# Patient Record
Sex: Female | Born: 1950 | Race: White | Hispanic: No | Marital: Married | Smoking: Never smoker
Health system: Southern US, Community
[De-identification: ages and names within clinical notes are randomized; demographics above are authoritative.]

## PROBLEM LIST (undated history)

## (undated) DIAGNOSIS — K219 Gastro-esophageal reflux disease without esophagitis: Secondary | ICD-10-CM

## (undated) DIAGNOSIS — T7840XA Allergy, unspecified, initial encounter: Secondary | ICD-10-CM

## (undated) DIAGNOSIS — G2581 Restless legs syndrome: Secondary | ICD-10-CM

## (undated) DIAGNOSIS — J40 Bronchitis, not specified as acute or chronic: Secondary | ICD-10-CM

## (undated) DIAGNOSIS — D099 Carcinoma in situ, unspecified: Secondary | ICD-10-CM

## (undated) DIAGNOSIS — M2540 Effusion, unspecified joint: Secondary | ICD-10-CM

## (undated) DIAGNOSIS — G252 Other specified forms of tremor: Secondary | ICD-10-CM

## (undated) DIAGNOSIS — R32 Unspecified urinary incontinence: Secondary | ICD-10-CM

## (undated) DIAGNOSIS — E7800 Pure hypercholesterolemia, unspecified: Secondary | ICD-10-CM

## (undated) DIAGNOSIS — M160 Bilateral primary osteoarthritis of hip: Secondary | ICD-10-CM

## (undated) DIAGNOSIS — F32A Depression, unspecified: Secondary | ICD-10-CM

## (undated) DIAGNOSIS — F329 Major depressive disorder, single episode, unspecified: Secondary | ICD-10-CM

## (undated) DIAGNOSIS — J45909 Unspecified asthma, uncomplicated: Secondary | ICD-10-CM

## (undated) DIAGNOSIS — M2550 Pain in unspecified joint: Secondary | ICD-10-CM

## (undated) HISTORY — PX: ANAL FISSURE REPAIR: SHX2312

## (undated) HISTORY — PX: COLONOSCOPY: SHX174

## (undated) HISTORY — PX: TONSILLECTOMY: SUR1361

## (undated) HISTORY — PX: OTHER SURGICAL HISTORY: SHX169

## (undated) HISTORY — PX: INCONTINENCE SURGERY: SHX676

---
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
## 2014-11-17 LAB — PULMONARY FUNCTION TEST

## 2014-12-02 ENCOUNTER — Encounter (HOSPITAL_COMMUNITY)
Admission: RE | Admit: 2014-12-02 | Discharge: 2014-12-02 | Disposition: A | Discharge status: Home / Self Care | Payer: Medicare Other | Source: Ambulatory Visit | Attending: Orthopaedic Surgery | Admitting: Orthopaedic Surgery

## 2014-12-02 ENCOUNTER — Encounter (HOSPITAL_COMMUNITY)
Admission: RE | Admit: 2014-12-02 | Discharge: 2014-12-02 | Disposition: A | Discharge status: Home / Self Care | Payer: Medicare Other | Source: Ambulatory Visit | Attending: Orthopedic Surgery | Admitting: Orthopedic Surgery

## 2014-12-02 ENCOUNTER — Encounter (HOSPITAL_COMMUNITY): Payer: Self-pay

## 2014-12-02 DIAGNOSIS — Z01812 Encounter for preprocedural laboratory examination: Secondary | ICD-10-CM | POA: Diagnosis not present

## 2014-12-02 DIAGNOSIS — Z79899 Other long term (current) drug therapy: Secondary | ICD-10-CM | POA: Diagnosis not present

## 2014-12-02 DIAGNOSIS — Z01810 Encounter for preprocedural cardiovascular examination: Secondary | ICD-10-CM | POA: Diagnosis not present

## 2014-12-02 DIAGNOSIS — Z882 Allergy status to sulfonamides status: Secondary | ICD-10-CM | POA: Diagnosis not present

## 2014-12-02 DIAGNOSIS — J45909 Unspecified asthma, uncomplicated: Secondary | ICD-10-CM | POA: Diagnosis not present

## 2014-12-02 DIAGNOSIS — M1611 Unilateral primary osteoarthritis, right hip: Secondary | ICD-10-CM | POA: Insufficient documentation

## 2014-12-02 DIAGNOSIS — M879 Osteonecrosis, unspecified: Secondary | ICD-10-CM | POA: Insufficient documentation

## 2014-12-02 DIAGNOSIS — Z01818 Encounter for other preprocedural examination: Secondary | ICD-10-CM | POA: Diagnosis present

## 2014-12-02 DIAGNOSIS — Z0183 Encounter for blood typing: Secondary | ICD-10-CM | POA: Diagnosis not present

## 2014-12-02 HISTORY — DX: Bronchitis, not specified as acute or chronic: J40

## 2014-12-02 HISTORY — DX: Unspecified asthma, uncomplicated: J45.909

## 2014-12-02 HISTORY — DX: Bilateral primary osteoarthritis of hip: M16.0

## 2014-12-02 HISTORY — DX: Major depressive disorder, single episode, unspecified: F32.9

## 2014-12-02 HISTORY — DX: Depression, unspecified: F32.A

## 2014-12-02 HISTORY — DX: Pure hypercholesterolemia, unspecified: E78.00

## 2014-12-02 LAB — COMPREHENSIVE METABOLIC PANEL
ALBUMIN: 4.3 g/dL (ref 3.5–5.0)
ALK PHOS: 86 U/L (ref 38–126)
ALT: 38 U/L (ref 14–54)
AST: 34 U/L (ref 15–41)
Anion gap: 10 (ref 5–15)
BUN: 14 mg/dL (ref 6–20)
CHLORIDE: 104 mmol/L (ref 101–111)
CO2: 25 mmol/L (ref 22–32)
CREATININE: 0.64 mg/dL (ref 0.44–1.00)
Calcium: 10.4 mg/dL — ABNORMAL HIGH (ref 8.9–10.3)
GFR calc Af Amer: >60 mL/min (ref >60)
GLUCOSE: 94 mg/dL (ref 65–99)
POTASSIUM: 3.9 mmol/L (ref 3.5–5.1)
Sodium: 139 mmol/L (ref 135–145)
Total Bilirubin: 0.6 mg/dL (ref 0.3–1.2)
Total Protein: 7.6 g/dL (ref 6.5–8.1)

## 2014-12-02 LAB — SURGICAL PCR SCREEN
MRSA, PCR: NEGATIVE
STAPHYLOCOCCUS AUREUS: NEGATIVE

## 2014-12-02 LAB — CBC WITH DIFFERENTIAL/PLATELET
Basophils Absolute: 0 10*3/uL (ref 0.0–0.1)
Basophils Relative: 0 % (ref 0–1)
EOS PCT: 5 % (ref 0–5)
Eosinophils Absolute: 0.5 10*3/uL (ref 0.0–0.7)
HCT: 42.9 % (ref 36.0–46.0)
Hemoglobin: 14.2 g/dL (ref 12.0–15.0)
LYMPHS ABS: 3 10*3/uL (ref 0.7–4.0)
LYMPHS PCT: 30 % (ref 12–46)
MCH: 28.2 pg (ref 26.0–34.0)
MCHC: 33.1 g/dL (ref 30.0–36.0)
MCV: 85.3 fL (ref 78.0–100.0)
MONOS PCT: 5 % (ref 3–12)
Monocytes Absolute: 0.5 10*3/uL (ref 0.1–1.0)
Neutro Abs: 5.9 10*3/uL (ref 1.7–7.7)
Neutrophils Relative %: 60 % (ref 43–77)
Platelets: 296 10*3/uL (ref 150–400)
RBC: 5.03 MIL/uL (ref 3.87–5.11)
RDW: 13.7 % (ref 11.5–15.5)
WBC: 10 10*3/uL (ref 4.0–10.5)

## 2014-12-02 LAB — TYPE AND SCREEN
ABO/RH(D): O NEG
Antibody Screen: NEGATIVE

## 2014-12-02 LAB — URINALYSIS, ROUTINE W REFLEX MICROSCOPIC
Bilirubin Urine: NEGATIVE
Glucose, UA: NEGATIVE mg/dL
Hgb urine dipstick: NEGATIVE
KETONES UR: NEGATIVE mg/dL
Leukocytes, UA: NEGATIVE
NITRITE: NEGATIVE
Protein, ur: NEGATIVE mg/dL
SPECIFIC GRAVITY, URINE: 1.011 (ref 1.005–1.030)
Urobilinogen, UA: 0.2 mg/dL (ref 0.0–1.0)
pH: 6.5 (ref 5.0–8.0)

## 2014-12-02 LAB — PROTIME-INR
INR: 1.04 (ref 0.00–1.49)
Prothrombin Time: 13.7 seconds (ref 11.6–15.2)

## 2014-12-02 LAB — ABO/RH: ABO/RH(D): O NEG

## 2014-12-02 LAB — APTT: aPTT: 33 seconds (ref 24–37)

## 2014-12-02 NOTE — Pre-Procedure Instructions (Signed)
Valerie Patterson  12/02/2014   Your procedure is scheduled on:  Tuesday, Dec 08, 2014  Report to [facility] Admitting at 8:00 AM.  Call this number if you have problems the morning of surgery: [phone]   Remember:   Do not eat food or drink liquids after midnight Monday, Dec 07, 2014   Take these medicines the morning of surgery with A SIP OF WATER: loratadine (CLARITIN), sertraline (ZOLOFT), Aclidinium Bromide inhaler, mometasone-formoterol (DULERA) inhaler, if needed:ranitidine (ZANTAC) for heartburn, if needed:budesonide (RHINOCORT AQUA) nasal spray, sodium chloride (OCEAN) nasal spray for congestion  levalbuterol (XOPENEX) 1.25 MG/3ML nebulizer for wheezing, albuterol (PROVENTIL HFA;VENTOLIN HFA)  Inhaler for wheezing or shortness of breath ( Bring inhaler in with you on day of procedure).  Stop taking Aspirin, vitamins and herbal medications such as Fish Oil-Cholecalciferol (OMEGA-3 FISH OIL-VITAMIN D3) and Nutritional Supplements (ESTROVEN MAXIMUM STRENGTH) .  DO not take any NSAIDs ie: Ibuprofen, Advil, Naproxen or any medication containing Aspirin.   Do not wear jewelry, make-up or nail polish.  Do not wear lotions, powders, or perfumes. You may not wear deodorant.  Do not shave 48 hours prior to surgery.   Do not bring valuables to the hospital.  [facility] is not responsible for any belongings or valuables.               Contacts, dentures or bridgework may not be worn into surgery.  Leave suitcase in the car. After surgery it may be brought to your room.  For patients admitted to the hospital, discharge time is determined by your treatment team.               Patients discharged the day of surgery will not be allowed to drive home.  Name and phone number of your driver:   Special Instructions:  Special Instructions:Special Instructions: [facility] - Preparing for Surgery  Before surgery, you can play an important role.  Because skin is not sterile, your skin  needs to be as free of germs as possible.  You can reduce the number of germs on you skin by washing with CHG (chlorahexidine gluconate) soap before surgery.  CHG is an antiseptic cleaner which kills germs and bonds with the skin to continue killing germs even after washing.  Please DO NOT use if you have an allergy to CHG or antibacterial soaps.  If your skin becomes reddened/irritated stop using the CHG and inform your nurse when you arrive at Short Stay.  Do not shave (including legs and underarms) for at least 48 hours prior to the first CHG shower.  You may shave your face.  Please follow these instructions carefully:   1.  Shower with CHG Soap the night before surgery and the morning of Surgery.  2.  If you choose to wash your hair, wash your hair first as usual with your normal shampoo.  3.  After you shampoo, rinse your hair and body thoroughly to remove the Shampoo.  4.  Use CHG as you would any other liquid soap.  You can apply chg directly  to the skin and wash gently with scrungie or a clean washcloth.  5.  Apply the CHG Soap to your body ONLY FROM THE NECK DOWN.  Do not use on open wounds or open sores.  Avoid contact with your eyes, ears, mouth and genitals (private parts).  Wash genitals (private parts) with your normal soap.  6.  Wash thoroughly, paying special attention to the area where your  surgery will be performed.  7.  Thoroughly rinse your body with warm water from the neck down.  8.  DO NOT shower/wash with your normal soap after using and rinsing off the CHG Soap.  9.  Pat yourself dry with a clean towel.            10.  Wear clean pajamas.            11.  Place clean sheets on your bed the night of your first shower and do not sleep with pets.  Day of Surgery  Do not apply any lotions/deodorants the morning of surgery.  Please wear clean clothes to the hospital/surgery center.   Please read over the following fact sheets that you were given: Pain Booklet, Coughing  and Deep Breathing, Blood Transfusion Information, Total Joint Packet, MRSA Information and Surgical Site Infection Prevention

## 2014-12-02 NOTE — Progress Notes (Signed)
Pt denies SOB, chest pain, and being under the care of a cardiologist. Pt denies having a stress test, echo and cardiac cath. Pt denies having an EKG and chest x ray within the last year. Pt PCP is Dr. Emelda Fear. Pt stated that she is on antibiotics for an upper respiratory infection and that had a steroid injection on Sunday ( 11/28/14). Pt chart forwarded to [facility]. PA ( anesthesia) to review chest x ray.

## 2014-12-03 ENCOUNTER — Encounter (HOSPITAL_COMMUNITY): Payer: Self-pay | Admitting: Emergency Medicine

## 2014-12-03 LAB — URINE CULTURE
COLONY COUNT: NO GROWTH
CULTURE: NO GROWTH

## 2014-12-06 NOTE — H&P (Signed)
CHIEF COMPLAINT:  Painful right hip.   HISTORY OF PRESENT ILLNESS:  Valerie Patterson is a very pleasant, 64 year old, white female who is seen today for evaluation of her right hip.  She has had chronic problems with her right hip dating back to March 2015.  At that time, there was somewhat of a radicular-type pain and pain in the anterior portion of her knee.  There was a note that she did have, in the right hip, periarticular spurring and sclerosing and narrowing of the joint space when compared to the left hip.  She has gone onto now have more pain and discomfort in the right hip.  She only had about 40% benefit with an injection of her right knee back in April 2015.  However, then a corticosteroid injection was given into the right hip which, for at least a month, did make quite a difference.  Certainly, she does have arthritis in the knee and the right hip, but the right hip has gotten to the point now where she cannot even lay down flat without having the leg bent at the hip.  She is now walking with a cane and unfortunately is also having pain with every step in regards to her right hip and groin area.  She comes in today for re-evaluation.   She is having problems with activities of daily living as well as pain with every step.  She has night time pain, and she is unable to get comfortable at all with sitting, standing, or lying down.  She has had a corticosteroid injection in the past which had been beneficial for only about a month.  She is seen today for evaluation.   CURRENT MEDICATIONS:  1.  Sertraline 100 mg daily. 2.  Niacin 500 mg 2 tablets a day 3.  Calcium 1200 mg with 1000 internation units of vitamin D daily. 4.  Fish oil 1200 mg plus 600 mg daily. 5.  Omega-3 and vitamin D 2000. 6.  Ranitidine 150 mg daily. 7.  Estroblend Maximum Strength daily. 8.  Loratidine 10 mg daily. 9.  Tudorza 400 mcg daily. 10.  Dulera. 11. Ventolin HFA. 12.  Budesonide 32 mcg spray.   ALLERGIES:  SULFA WHICH  GIVES HER HIVES, PRAVASTATIN GAVE HER JOINT PAIN, ERYTHROMYCIN CAUSES VOMITING, AND COMPAZINE GAVE HER DRY MOUTH.   PAST SURGICAL HISTORY:  Hospitalizations include: 1.  1994 for anal fissure surgery. 2.  1964 for tonsillectomy. 3.  2006 for a pubovaginal sling. 4.  2011 for endovenous laser ablation bilaterally. 5.  Childbirth x2 in 1976 and 1980. 6.  2008 for acute bronchitis.   SOCIAL HISTORY:  She is a 64 year old, white, married female who is retired from a self-employed Arboriculturist.  She denies the use of tobacco.  She possibly drinks maybe 4 times a year with 1 drink.   FAMILY HISTORY:  Positive for a mother who is alive at 68 years old and has had skin and colon cancer.  Her father is alive at age 43 with a bleeding disorder as well as a heart attack which was mild and also has gout, hypertension, and diabetes.  He also had skin and prostate cancer.  She has a brother who is 71 years old and healthy and 2 sisters age 15 and 1.   REVIEW OF SYSTEMS:  Fourteen point review of systems is positive for glasses and a morning cough.  She does have bronchitis and recently on Nov 29, 2014, she had been treated at an urgent  care for the bronchitis.  She has had hemorrhoids which have been non-operative.  She developed asthma in 2008.   PHYSICAL EXAMINATION:  Reveals a very pleasant, 64 year old, white female who is well developed, well nourished, alert, pleasant, cooperative, and in moderate distress secondary to right groin pain.  She is 5 feet 1-1/2 inches.  Her weight is 148 pounds with a BMI of 27.5.   Vital signs:  Temperature 99.3.  Pulse 92.  Respirations 14.  Blood pressure 124/60. Head:  Head is normocephalic. Eyes:  Pupils are equal, round, and reactive to light and accommodation with extraocular movements intact. Ears, nose, and throat:  Benign. Chest:  Good expansion of lungs. Neck:  Supple and no carotid bruits noted. Lungs:  Coarse breath sounds with marked wheezing  bilaterally. Cardiac:  Regular rhythm and rate.  Normal S1 and S2, and no murmurs were noted. Pulses:  1+ bilateral and symmetric in the lower extremities. Abdomen:  Scaphoid, soft, and nontender.  No mass palpable.  Normal bowel sounds presents. Neurological:  She is oriented x3, and cranial nerves II-XII are grossly intact. Musculoskeletal:  Today, she has minimal motion of the right hip with internal and external rotation.  Unfortunately, when she lies on her back, she has to keep the hip at about 30 degrees of flexion to be comfortable.  I was able to get her to 90 degrees of flexion, which of course was painful also.  She is neurovascularly intact distally.   RADIOGRAPHS:  Radiographic studies reveal the right hip to have, what appears to be, avascular necrosis with marked degenerative joint disease.  She has no space at all and has periarticular spurring and cystic changes in both the acetabulum as well as the femoral head.   CLINICAL IMPRESSION:   1.  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 2.  History of asthma. 3.  Recent bronchitis.   RECOMMENDATIONS:   1.  At this time, I have reviewed a form from Dr. Emelda Fear, medical doctor, who felt that she was clear from both a medical standpoint as well as a cardiac standpoint.  I have also reviewed a form from Dr. Raelene Bott who felt that from a pulmonary standpoint, she was also cleared; however, that was performed on Nov 23, 2014, and she has recently had bronchitis and was treated at an urgent care. 2.  At this time, certainly she is a candidate for a right total hip arthroplasty; however, with her recent exacerbation of bronchitis and being under treatment, we will have to make sure that she continues to improve.  The procedure, risks, and benefits were fully explained to her as well as all of the complications.  All questions were answered.  She is going to follow up with her  pulmonologist this week, and if she continues to improve, then we can proceed with a right total hip arthroplasty in the near future.  She is understanding of this.  Mike Craze [place], [place] [phone]  12/06/2014 10:16 PM

## 2014-12-08 ENCOUNTER — Encounter (HOSPITAL_COMMUNITY): Admission: RE | Payer: Self-pay | Source: Ambulatory Visit

## 2014-12-08 ENCOUNTER — Inpatient Hospital Stay (HOSPITAL_COMMUNITY): Admission: RE | Admit: 2014-12-08 | Payer: Medicare Other | Source: Ambulatory Visit | Admitting: Orthopaedic Surgery

## 2014-12-08 SURGERY — ARTHROPLASTY, HIP, TOTAL,POSTERIOR APPROACH
Anesthesia: General | Site: Hip | Laterality: Right

## 2014-12-18 ENCOUNTER — Encounter: Payer: Self-pay | Admitting: Internal Medicine

## 2014-12-18 ENCOUNTER — Ambulatory Visit (INDEPENDENT_AMBULATORY_CARE_PROVIDER_SITE_OTHER): Payer: Medicare Other | Admitting: Internal Medicine

## 2014-12-18 VITALS — BP 132/72 | HR 75 | Ht 61.5 in | Wt 150.8 lb

## 2014-12-18 DIAGNOSIS — R05 Cough: Secondary | ICD-10-CM

## 2014-12-18 DIAGNOSIS — R058 Other specified cough: Secondary | ICD-10-CM | POA: Insufficient documentation

## 2014-12-18 DIAGNOSIS — J4530 Mild persistent asthma, uncomplicated: Secondary | ICD-10-CM | POA: Insufficient documentation

## 2014-12-18 MED ORDER — RANITIDINE HCL 150 MG PO TABS: 150.0000 mg | ORAL_TABLET | Freq: Two times a day (BID) | ORAL | Status: DC

## 2014-12-18 MED ORDER — MOMETASONE FURO-FORMOTEROL FUM 100-5 MCG/ACT IN AERO: INHALATION_SPRAY | RESPIRATORY_TRACT | Status: DC

## 2014-12-18 MED ORDER — ALBUTEROL SULFATE HFA 108 (90 BASE) MCG/ACT IN AERS: INHALATION_SPRAY | RESPIRATORY_TRACT | Status: DC

## 2014-12-18 MED ORDER — PREDNISONE 10 MG PO TABS: ORAL_TABLET | ORAL | Status: DC

## 2014-12-18 MED ORDER — RANITIDINE HCL 150 MG PO TABS: ORAL_TABLET | ORAL | Status: DC

## 2014-12-18 NOTE — Patient Instructions (Addendum)
Pantoprazole (protonix) 40 mg   Take  30-60 min before first meal of the day and Zantac 150  At bedtime @  bedtime until return to office - this is the best way to tell whether stomach acid is contributing to your problem.    GERD (REFLUX)  is an extremely common cause of respiratory symptoms just like yours , many times with no obvious heartburn at all.    It can be treated with medication, but also with lifestyle changes including avoidance of late meals, elevation of the head of your bed (ideally with 6 inch  bed blocks) excessive alcohol, smoking cessation, and avoid fatty foods, chocolate, peppermint, colas, red wine, and acidic juices such as orange juice.  NO MINT OR MENTHOL PRODUCTS SO NO COUGH DROPS  USE SUGARLESS CANDY INSTEAD (Jolley ranchers or Stover's or Life Savers) or even ice chips will also do - the key is to swallow to prevent all throat clearing. NO OIL BASED VITAMINS - use powdered substitutes.   Prednisone 10 mg take  4 each am x 2 days,   2 each am x 2 days,  1 each am x 2 days and stop   Stop dulera 200 and the tudorza and use dulera 100 Take 2 puffs first thing in am and then another 2 puffs about 12 hours later.   Work on inhaler technique:  relax and gently blow all the way out then take a nice smooth deep breath back in, triggering the inhaler at same time you start breathing in.  Hold for up to 5 seconds if you can.  Rinse and gargle with water when done      Only use your albuterol (proaire) as a rescue medication to be used if you can't catch your breath by resting or doing a relaxed purse lip breathing pattern.  - The less you use it, the better it will work when you need it. - Ok to use up to 2 puffs  every 4 hours if you must but call for immediate appointment if use goes up over your usual need - Don't leave home without it !!  (think of it like the spare tire for your car)   Only use your nebulizer albuterol if you try the Proaire (inhaler albuterol) and it  fails to work  If better tell your doctor and friends, if not all better see me!

## 2014-12-18 NOTE — Progress Notes (Signed)
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    12/18/2014 1[facility] office visit/ [place] Billiter   Chief Complaint  Patient presents with  . Pulmonary Consult    Self referral. Pt states dxed with Asthma in 2008. She c/o increased SOB for the past month- better with taking albuterol nebs 3 x daily.  She also c/o cough with clear sputum and PND.   worse since last set of pfts 11/17/14 which showed minimal airflow obstruction  Main symptom now is constant urge to clear throat/ worse when eat/ also when lies down but once goes to sleep has no problem and activity is limited by hip pain, no doe.  No obvious other patterns in day to day or daytime variabilty or assoc   cp or chest tightness, subjective wheeze overt sinus or hb symptoms. No unusual exp hx or h/o childhood pna/ asthma or knowledge of premature birth.  Sleeping ok without nocturnal  or early am exacerbation  of respiratory  c/o's or need for noct saba. Also denies any obvious fluctuation of symptoms with weather or environmental changes or other aggravating or alleviating factors except as outlined above   Current Medications, Allergies, Complete Past Medical History, Past Surgical History, Family History, and Social History were reviewed in [employer] record.          Review of Systems  Constitutional: Negative for fever, chills and unexpected weight change.  HENT: Positive for congestion and postnasal drip. Negative for dental problem, ear pain, nosebleeds, rhinorrhea, sinus pressure, sneezing, sore throat, trouble swallowing and voice change.   Eyes: Negative for visual disturbance.    Respiratory: Positive for cough and shortness of breath. Negative for choking.   Cardiovascular: Negative for chest pain and leg swelling.  Gastrointestinal: Negative for vomiting, abdominal pain and diarrhea.  Genitourinary: Negative for difficulty urinating.       Acid heartburn  Musculoskeletal: Negative for arthralgias.  Skin: Negative for rash.  Neurological: Negative for tremors, syncope and headaches.  Hematological: Does not bruise/bleed easily.       Objective:   Physical Exam  Wt Readings from Last 3 Encounters:  12/18/14 150 lb 12.8 oz (68.402 kg)    Vital signs reviewed   W/c bound hoarse wf with freq throat clearing/ voice fatigue/ prominent pseudowheeze    HEENT: nl dentition, turbinates, and orophanx. Nl external ear canals without cough reflex   NECK :  without JVD/Nodes/TM/ nl carotid upstrokes bilaterally   LUNGS: no acc muscle use, clear to A and P bilaterally without cough on insp or exp maneuvers   CV:  RRR  no s3 or murmur or increase in P2, no edema   ABD:  soft and nontender with nl excursion in the supine position. No bruits or organomegaly, bowel sounds nl  MS:  warm without deformities, calf tenderness, cyanosis or clubbing  SKIN: warm and dry without lesions    NEURO:  alert, approp, no deficits    I personally reviewed images and agree with radiology impression as follows:  CXR:  12/02/2014 Mild density noted projected over the lingula, most likely prominent epicardial fat  pad. Exam otherwise unremarkable.           Assessment & Plan:   Outpatient Encounter Prescriptions as of 12/18/2014  Medication Sig  . albuterol (PROVENTIL) (2.5 MG/3ML) 0.083% nebulizer solution Take 2.5 mg by nebulization 3 (three) times daily.  . cetirizine (ZYRTEC) 10 MG tablet Take 10 mg by mouth daily.  . diphenhydrAMINE (BENADRYL) 25 MG tablet Take 25 mg by mouth every 6 (six) hours as needed.  . sertraline (ZOLOFT) 100 MG tablet Take 100 mg by mouth  daily.  . sodium chloride (OCEAN) 0.65 % SOLN nasal spray Place 1 spray into both nostrils as needed for congestion.  . [DISCONTINUED] Aclidinium Bromide 400 MCG/ACT AEPB Inhale 1 puff into the lungs 2 (two) times daily.  . [DISCONTINUED] albuterol (PROVENTIL HFA;VENTOLIN HFA) 108 (90 BASE) MCG/ACT inhaler Inhale 2 puffs into the lungs every 6 (six) hours as needed for wheezing or shortness of breath.  . [DISCONTINUED] mometasone-formoterol (DULERA) 200-5 MCG/ACT AERO Inhale 2 puffs into the lungs 2 (two) times daily.  . [DISCONTINUED] ranitidine (ZANTAC) 150 MG tablet Take 150 mg by mouth 2 (two) times daily as needed for heartburn.  Marland Kitchen albuterol (PROAIR HFA) 108 (90 BASE) MCG/ACT inhaler 2 puffs every 4 hours as needed only  if your can't catch your breath  . mometasone-formoterol (DULERA) 100-5 MCG/ACT AERO Take 2 puffs first thing in am and then another 2 puffs about 12 hours later.  . predniSONE (DELTASONE) 10 MG tablet Take  4 each am x 2 days,   2 each am x 2 days,  1 each am x 2 days and stop  . ranitidine (ZANTAC) 150 MG tablet One at bedtime  . [DISCONTINUED] albuterol (PROVENTIL HFA;VENTOLIN HFA) 108 (90 BASE) MCG/ACT inhaler Inhale 2 puffs into the lungs every 6 (six) hours as needed for wheezing or shortness of breath.  . [DISCONTINUED] budesonide (RHINOCORT AQUA) 32 MCG/ACT nasal spray Place 2 sprays into both nostrils daily as needed for rhinitis.  . [DISCONTINUED] Calcium-Magnesium-Vitamin D (CALCIUM 1200+D3 PO) Take 1 tablet by mouth daily.  . [DISCONTINUED] Fish Oil-Cholecalciferol (OMEGA-3 FISH OIL-VITAMIN D3) 1200-1000 MG-UNIT CAPS Take 1 capsule by mouth daily.  . [DISCONTINUED] levalbuterol (XOPENEX) 1.25 MG/3ML nebulizer solution Take 1.25 mg by nebulization every 4 (four) hours as needed for wheezing.  . [DISCONTINUED] levofloxacin (LEVAQUIN) 500 MG tablet Take 500 mg by mouth daily. 10 day supply  . [DISCONTINUED] loratadine (CLARITIN) 10 MG tablet Take 10 mg by mouth daily.    . [DISCONTINUED] Multiple Vitamins-Minerals (ICAPS AREDS FORMULA PO) Take 2 capsules by mouth daily.  . [DISCONTINUED] niacin 500 MG tablet Take 1,000 mg by mouth every morning.  . [DISCONTINUED] Nutritional Supplements (ESTROVEN MAXIMUM STRENGTH PO) Take 1 tablet by mouth daily.  . [DISCONTINUED] phenylephrine-shark liver oil-mineral oil-petrolatum (PREPARATION H) 0.25-3-14-71.9 % rectal ointment Place 1 application rectally 2 (two) times daily as needed for hemorrhoids.  . [DISCONTINUED] ranitidine (ZANTAC) 150 MG tablet Take 1 tablet (150 mg total) by mouth 2 (two) times daily.   No facility-administered encounter medications on file as of 12/18/2014.

## 2014-12-18 NOTE — Assessment & Plan Note (Addendum)
-   PFTs  11/17/14  FEV1  2.10 (96%) ratio 68 with dlco 108  And no sign response to saba    DDX of  difficult airways management all start with A and  include Adherence, Ace Inhibitors, Acid Reflux, Active Sinus Disease, Alpha 1 Antitripsin deficiency, Anxiety masquerading as Airways dz,  ABPA,  allergy(esp in young), Aspiration (esp in elderly), Adverse effects of DPI,  Active smokers, plus two Bs  = Bronchiectasis and Beta blocker use..and one C= CHF   Adherence is always the initial "prime suspect" and is a multilayered concern that requires a "trust but verify" approach in every patient - starting with knowing how to use medications, especially inhalers, correctly, keeping up with refills and understanding the fundamental difference between maintenance and prns vs those medications only taken for a very short course and then stopped and not refilled.  The proper method of use, as well as anticipated side effects, of a metered-dose inhaler are discussed and demonstrated to the patient. Improved effectiveness after extensive coaching during this visit to a level of approximately  50% , no better, try dulera 100 2bid  ? Acid (or non-acid) GERD > always difficult to exclude as up to 75% of pts in some series report no assoc GI/ Heartburn symptoms> rec max (24h)  acid suppression and diet restrictions/ reviewed and instructions given in writing.   ? Adverse effects of dpi > d/c tudorza as this isn't copd anyway  ? Allergies > Prednisone 10 mg take  4 each am x 2 days,   2 each am x 2 days,  1 each am x 2 days and stop   I had an extended discussion with the patient reviewing all relevant studies completed to date and  Lasting 35 minutes  t on the following ongoing concerns:   Each maintenance medication was reviewed in detail including most importantly the difference between maintenance and as needed and under what circumstances the prns are to be used.  Please see instructions for details which were  reviewed in writing and the patient given a copy.    Discussed in detail all the  indications, usual  risks and alternatives  relative to the benefits with patient who agrees to proceed with R hip surgery as planned - cleared for surgery though there is risk ET with exacerbate UACS (see sep a/p)

## 2014-12-18 NOTE — Assessment & Plan Note (Signed)
The most common causes of chronic cough in immunocompetent adults include the following: upper airway cough syndrome (UACS), previously referred to as postnasal drip syndrome (PNDS), which is caused by variety of rhinosinus conditions; (2) asthma; (3) GERD; (4) chronic bronchitis from cigarette smoking or other inhaled environmental irritants; (5) nonasthmatic eosinophilic bronchitis; and (6) bronchiectasis.   These conditions, singly or in combination, have accounted for up to 94% of the causes of chronic cough in prospective studies.   Other conditions have constituted no >6% of the causes in prospective studies These have included bronchogenic carcinoma, chronic interstitial pneumonia, sarcoidosis, left ventricular failure, ACEI-induced cough, and aspiration from a condition associated with pharyngeal dysfunction.    Chronic cough is often simultaneously caused by more than one condition. A single cause has been found from 38 to 82% of the time, multiple causes from 18 to 62%. Multiply caused cough has been the result of three diseases up to 42% of the time.       Based on hx and exam, this is most likely:  Classic Upper airway cough syndrome, so named because it's frequently impossible to sort out how much is  CR/sinusitis with freq throat clearing (which can be related to primary GERD)   vs  causing  secondary (" extra esophageal")  GERD from wide swings in gastric pressure that occur with throat clearing, often  promoting self use of mint and menthol lozenges that reduce the lower esophageal sphincter tone and exacerbate the problem further in a cyclical fashion.   These are the same pts (now being labeled as having "irritable larynx syndrome" by some cough centers) who not infrequently have a history of having failed to tolerate ace inhibitors,  dry powder inhalers or biphosphonates or report having atypical reflux symptoms that don't respond to standard doses of PPI , and are easily confused as  having aecopd or asthma flares by even experienced allergists/ pulmonologists.   The first step is to maximize acid suppression and eliminate cyclical coughing with hard rock candy  then regroup if the cough persists.  See instructions for specific recommendations which were reviewed directly with the patient who was given a copy with highlighter outlining the key components.

## 2014-12-26 NOTE — Pre-Procedure Instructions (Signed)
Valerie Patterson  12/26/2014       Your procedure is scheduled on June 21  Report to [facility] Admitting at 8 A.M.  Call this number if you have problems the morning of surgery:  [phone]   Remember:  Do not eat food or drink liquids after midnight.  Take these medicines the morning of surgery with A SIP OF WATER Zyrtec, Dulera, Zantac, Zoloft   STOP/ Do not take Aspirin, Aleve, Naproxen, Advil, Ibuprofen, Motrin, Vitamins, Herbs, or Supplements starting today   Do not wear jewelry, make-up or nail polish.  Do not wear lotions, powders, or perfumes.  You may wear deodorant.  Do not shave 48 hours prior to surgery.  Men may shave face and neck.  Do not bring valuables to the hospital.  [facility] is not responsible for any belongings or valuables.  Contacts, dentures or bridgework may not be worn into surgery.  Leave your suitcase in the car.  After surgery it may be brought to your room.  For patients admitted to the hospital, discharge time will be determined by your treatment team.  Patients discharged the day of surgery will not be allowed to drive home.   [facility] - Preparing for Surgery  Before surgery, you can play an important role.  Because skin is not sterile, your skin needs to be as free of germs as possible.  You can reduce the number of germs on you skin by washing with CHG (chlorahexidine gluconate) soap before surgery.  CHG is an antiseptic cleaner which kills germs and bonds with the skin to continue killing germs even after washing.  Please DO NOT use if you have an allergy to CHG or antibacterial soaps.  If your skin becomes reddened/irritated stop using the CHG and inform your nurse when you arrive at Short Stay.  Do not shave (including legs and underarms) for at least 48 hours prior to the first CHG shower.  You may shave your face.  Please follow these instructions carefully:   1.  Shower with CHG Soap the night before surgery and the  morning of Surgery.  2.  If you choose to wash your hair, wash your hair first as usual with your normal shampoo.  3.  After you shampoo, rinse your hair and body thoroughly to remove the shampoo.  4.  Use CHG as you would any other liquid soap.  You can apply CHG directly to the skin and wash gently with scrungie or a clean washcloth.  5.  Apply the CHG Soap to your body ONLY FROM THE NECK DOWN.  Do not use on open wounds or open sores.  Avoid contact with your eyes, ears, mouth and genitals (private parts).  Wash genitals (private parts) with your normal soap.  6.  Wash thoroughly, paying special attention to the area where your surgery will be performed.  7.  Thoroughly rinse your body with warm water from the neck down.  8.  DO NOT shower/wash with your normal soap after using and rinsing off the CHG Soap.  9.  Pat yourself dry with a clean towel.            10.  Wear clean pajamas.            11.  Place clean sheets on your bed the night of your first shower and do not sleep with pets.  Day of Surgery  Do not apply any lotions the morning of surgery.  Please wear clean clothes to the hospital/surgery center.    Please read over the following fact sheets that you were given. Pain Booklet, Coughing and Deep Breathing, Blood Transfusion Information and Surgical Site Infection Prevention

## 2014-12-28 ENCOUNTER — Encounter (HOSPITAL_COMMUNITY): Payer: Self-pay

## 2014-12-28 ENCOUNTER — Encounter (HOSPITAL_COMMUNITY)
Admission: RE | Admit: 2014-12-28 | Discharge: 2014-12-28 | Disposition: A | Discharge status: Home / Self Care | Payer: Medicare Other | Source: Ambulatory Visit | Attending: Orthopaedic Surgery | Admitting: Orthopaedic Surgery

## 2014-12-28 HISTORY — DX: Other specified forms of tremor: G25.2

## 2014-12-28 LAB — BASIC METABOLIC PANEL
Anion gap: 7 (ref 5–15)
BUN: 13 mg/dL (ref 6–20)
CO2: 26 mmol/L (ref 22–32)
CREATININE: 0.56 mg/dL (ref 0.44–1.00)
Calcium: 9.5 mg/dL (ref 8.9–10.3)
Chloride: 105 mmol/L (ref 101–111)
GFR calc Af Amer: >60 mL/min (ref >60)
Glucose, Bld: 97 mg/dL (ref 65–99)
Potassium: 3.9 mmol/L (ref 3.5–5.1)
SODIUM: 138 mmol/L (ref 135–145)

## 2014-12-28 LAB — TYPE AND SCREEN
ABO/RH(D): O NEG
Antibody Screen: NEGATIVE

## 2014-12-28 LAB — CBC
HCT: 39.4 % (ref 36.0–46.0)
Hemoglobin: 12.8 g/dL (ref 12.0–15.0)
MCH: 27.7 pg (ref 26.0–34.0)
MCHC: 32.5 g/dL (ref 30.0–36.0)
MCV: 85.3 fL (ref 78.0–100.0)
Platelets: 274 10*3/uL (ref 150–400)
RBC: 4.62 MIL/uL (ref 3.87–5.11)
RDW: 13.5 % (ref 11.5–15.5)
WBC: 7.4 10*3/uL (ref 4.0–10.5)

## 2014-12-28 LAB — SURGICAL PCR SCREEN
MRSA, PCR: NEGATIVE
STAPHYLOCOCCUS AUREUS: NEGATIVE

## 2014-12-30 NOTE — H&P (Signed)
CHIEF COMPLAINT:  Painful right hip.   HISTORY OF PRESENT ILLNESS:  Dalana is a very pleasant, 64 year old, white female who is seen today for evaluation of her right hip.  She has had chronic problems with her right hip dating back to March 2015.  At that time, there was somewhat of a radicular-type pain and pain in the anterior portion of her knee.  There was a note that she did have, in the right hip, periarticular spurring and sclerosing and narrowing of the joint space when compared to the left hip.  She has gone onto now have more pain and discomfort in the right hip.  She only had about 40% benefit with an injection of her right knee back in April 2015.  However, then a corticosteroid injection was given into the right hip which, for at least a month, did make quite a difference.  Certainly, she does have arthritis in the knee and the right hip, but the right hip has gotten to the point now where she cannot even lay down flat without having the leg bent at the hip.  She is now walking with a cane and unfortunately is also having pain with every step in regards to her right hip and groin area.  She comes in today for re-evaluation.   She is having problems with activities of daily living as well as pain with every step.  She has night time pain, and she is unable to get comfortable at all with sitting, standing, or lying down.  She has had a corticosteroid injection in the past which had been beneficial for only about a month.  She is seen today for evaluation.   CURRENT MEDICATIONS:  1.  Sertraline 100 mg daily. 2.  Niacin 500 mg 2 tablets a day 3.  Calcium 1200 mg with 1000 internation units of vitamin D daily. 4.  Fish oil 1200 mg plus 600 mg daily. 5.  Omega-3 and vitamin D 2000. 6.  Ranitidine 150 mg daily. 7.  Estroblend Maximum Strength daily. 8.  Loratidine 10 mg daily. 9.  Tudorza 400 mcg daily. 10.  Dulera. 11. Ventolin HFA. 12.  Budesonide 32 mcg spray.   ALLERGIES:  SULFA  WHICH GIVES HER HIVES, PRAVASTATIN GAVE HER JOINT PAIN, ERYTHROMYCIN CAUSES VOMITING, AND COMPAZINE GAVE HER DRY MOUTH.   PAST SURGICAL HISTORY:  Hospitalizations include: 1.  1994 for anal fissure surgery. 2.  1964 for tonsillectomy. 3.  2006 for a pubovaginal sling. 4.  2011 for endovenous laser ablation bilaterally. 5.  Childbirth x2 in 1976 and 1980. 6.  2008 for acute bronchitis.   SOCIAL HISTORY:  She is a 64 year old, white, married female who is retired from a self-employed Arboriculturist.  She denies the use of tobacco.  She possibly drinks maybe 4 times a year with 1 drink.   FAMILY HISTORY:  Positive for a mother who is alive at 4 years old and has had skin and colon cancer.  Her father is alive at age 6 with a bleeding disorder as well as a heart attack which was mild and also has gout, hypertension, and diabetes.  He also had skin and prostate cancer.  She has a brother who is 30 years old and healthy and 2 sisters age 52 and 53.   REVIEW OF SYSTEMS:  Fourteen point review of systems is positive for glasses and a morning cough.  She does have bronchitis and recently on Nov 29, 2014, she had been treated at an urgent  care for the bronchitis.  She has had hemorrhoids which have been non-operative.  She developed asthma in 2008.   PHYSICAL EXAMINATION:  Reveals a very pleasant, 64 year old, white female who is well developed, well nourished, alert, pleasant, cooperative, and in moderate distress secondary to right groin pain.  She is 5 feet 1-1/2 inches.  Her weight is 148 pounds with a BMI of 27.5.   Vital signs:  Temperature 99.3.  Pulse 92.  Respirations 14.  Blood pressure 124/60. Head:  Head is normocephalic. Eyes:  Pupils are equal, round, and reactive to light and accommodation with extraocular movements intact. Ears, nose, and throat:  Benign. Chest:  Good expansion of lungs. Neck:  Supple and no carotid bruits noted. Lungs:  Clear breath sounds  Cardiac:  Regular  rhythm and rate.  Normal S1 and S2, and no murmurs were noted. Pulses:  1+ bilateral and symmetric in the lower extremities. Abdomen:  Scaphoid, soft, and nontender.  No mass palpable.  Normal bowel sounds presents. Neurological:  She is oriented x3, and cranial nerves II-XII are grossly intact. Musculoskeletal:  Today, she has minimal motion of the right hip with internal and external rotation.  Unfortunately, when she lies on her back, she has to keep the hip at about 30 degrees of flexion to be comfortable.  I was able to get her to 90 degrees of flexion, which of course was painful also.  She is neurovascularly intact distally.   RADIOGRAPHS:  Radiographic studies reveal the right hip to have, what appears to be, avascular necrosis with marked degenerative joint disease.  She has no space at all and has periarticular spurring and cystic changes in both the acetabulum as well as the femoral head.   CLINICAL IMPRESSION:   1.  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 2.  History of asthma. 3.  Recent bronchitis.   RECOMMENDATIONS:   1.  At this time, I have reviewed a form from Dr. Emelda Fear, medical doctor, who felt that she was clear from both a medical standpoint as well as a cardiac standpoint.  I have also reviewed a form from Dr. Raelene Bott who felt that from a pulmonary standpoint, she was also cleared; however, that was performed on Nov 23, 2014, and she has recently had bronchitis and was treated at an urgent care. 2.  At this time, certainly she is a candidate for a right total hip arthroplasty; however, with her recent exacerbation of bronchitis and being under treatment, we will have to make sure that she continues to improve.  She has been treated appropriately by her medical doctor and is now medically cleared for surgery.  The procedure, risks, and benefits were fully explained to her as well as all of the complications.   All questions were answered.  We can proceed with a right total hip arthroplasty in the near future.  She is understanding of this.  Mike Craze [place], [place] [phone]  01/04/2015 3:13 PM

## 2015-01-04 HISTORY — PX: TOTAL HIP ARTHROPLASTY: SHX124

## 2015-01-04 MED ORDER — CEFAZOLIN SODIUM-DEXTROSE 2-3 GM-% IV SOLR
2.0000 g | INTRAVENOUS | Status: AC
  Administered 2015-01-05: 2 g via INTRAVENOUS
  Filled 2015-01-04: qty 50

## 2015-01-04 MED ORDER — ACETAMINOPHEN 10 MG/ML IV SOLN
1000.0000 mg | INTRAVENOUS | Status: AC
  Administered 2015-01-05: 1000 mg via INTRAVENOUS
  Filled 2015-01-04: qty 100

## 2015-01-04 MED ORDER — SODIUM CHLORIDE 0.9 % IV SOLN: 75.0000 mL/h | INTRAVENOUS | Status: DC

## 2015-01-05 ENCOUNTER — Inpatient Hospital Stay (HOSPITAL_COMMUNITY)
Admission: RE | Admit: 2015-01-05 | Discharge: 2015-01-07 | DRG: 470 | Disposition: A | Discharge status: Home Health | Payer: Medicare Other | Source: Ambulatory Visit | Attending: Orthopaedic Surgery | Admitting: Orthopaedic Surgery

## 2015-01-05 ENCOUNTER — Inpatient Hospital Stay (HOSPITAL_COMMUNITY): Payer: Medicare Other | Admitting: Anesthesiology

## 2015-01-05 ENCOUNTER — Encounter (HOSPITAL_COMMUNITY)
Admission: RE | Disposition: A | Discharge status: Home Health | Payer: Self-pay | Source: Ambulatory Visit | Attending: Orthopaedic Surgery

## 2015-01-05 ENCOUNTER — Inpatient Hospital Stay (HOSPITAL_COMMUNITY): Payer: Medicare Other

## 2015-01-05 ENCOUNTER — Encounter (HOSPITAL_COMMUNITY): Payer: Self-pay | Admitting: *Deleted

## 2015-01-05 DIAGNOSIS — Y92239 Unspecified place in hospital as the place of occurrence of the external cause: Secondary | ICD-10-CM

## 2015-01-05 DIAGNOSIS — K219 Gastro-esophageal reflux disease without esophagitis: Secondary | ICD-10-CM | POA: Diagnosis present

## 2015-01-05 DIAGNOSIS — Z79899 Other long term (current) drug therapy: Secondary | ICD-10-CM | POA: Diagnosis not present

## 2015-01-05 DIAGNOSIS — M87851 Other osteonecrosis, right femur: Secondary | ICD-10-CM | POA: Diagnosis present

## 2015-01-05 DIAGNOSIS — Z881 Allergy status to other antibiotic agents status: Secondary | ICD-10-CM | POA: Diagnosis not present

## 2015-01-05 DIAGNOSIS — M659 Synovitis and tenosynovitis, unspecified: Secondary | ICD-10-CM | POA: Diagnosis present

## 2015-01-05 DIAGNOSIS — M160 Bilateral primary osteoarthritis of hip: Secondary | ICD-10-CM | POA: Diagnosis present

## 2015-01-05 DIAGNOSIS — J80 Acute respiratory distress syndrome: Secondary | ICD-10-CM | POA: Diagnosis not present

## 2015-01-05 DIAGNOSIS — Z882 Allergy status to sulfonamides status: Secondary | ICD-10-CM | POA: Diagnosis not present

## 2015-01-05 DIAGNOSIS — Z888 Allergy status to other drugs, medicaments and biological substances status: Secondary | ICD-10-CM

## 2015-01-05 DIAGNOSIS — M179 Osteoarthritis of knee, unspecified: Secondary | ICD-10-CM | POA: Diagnosis present

## 2015-01-05 DIAGNOSIS — Z96649 Presence of unspecified artificial hip joint: Secondary | ICD-10-CM

## 2015-01-05 DIAGNOSIS — M1611 Unilateral primary osteoarthritis, right hip: Secondary | ICD-10-CM | POA: Diagnosis present

## 2015-01-05 DIAGNOSIS — E780 Pure hypercholesterolemia: Secondary | ICD-10-CM | POA: Diagnosis present

## 2015-01-05 DIAGNOSIS — M25551 Pain in right hip: Secondary | ICD-10-CM | POA: Diagnosis present

## 2015-01-05 DIAGNOSIS — Z825 Family history of asthma and other chronic lower respiratory diseases: Secondary | ICD-10-CM | POA: Diagnosis not present

## 2015-01-05 DIAGNOSIS — Z7951 Long term (current) use of inhaled steroids: Secondary | ICD-10-CM | POA: Diagnosis not present

## 2015-01-05 DIAGNOSIS — F329 Major depressive disorder, single episode, unspecified: Secondary | ICD-10-CM | POA: Diagnosis present

## 2015-01-05 DIAGNOSIS — J4531 Mild persistent asthma with (acute) exacerbation: Secondary | ICD-10-CM | POA: Diagnosis not present

## 2015-01-05 DIAGNOSIS — T402X5A Adverse effect of other opioids, initial encounter: Secondary | ICD-10-CM | POA: Diagnosis not present

## 2015-01-05 DIAGNOSIS — D62 Acute posthemorrhagic anemia: Secondary | ICD-10-CM | POA: Diagnosis not present

## 2015-01-05 DIAGNOSIS — R0603 Acute respiratory distress: Secondary | ICD-10-CM

## 2015-01-05 HISTORY — PX: TOTAL HIP ARTHROPLASTY: SHX124

## 2015-01-05 HISTORY — DX: Gastro-esophageal reflux disease without esophagitis: K21.9

## 2015-01-05 SURGERY — ARTHROPLASTY, HIP, TOTAL,POSTERIOR APPROACH
Anesthesia: Monitor Anesthesia Care | Site: Hip | Laterality: Right

## 2015-01-05 MED ORDER — ONDANSETRON HCL 4 MG PO TABS: 4.0000 mg | ORAL_TABLET | Freq: Four times a day (QID) | ORAL | Status: DC | PRN

## 2015-01-05 MED ORDER — FAMOTIDINE 20 MG PO TABS
20.0000 mg | ORAL_TABLET | Freq: Two times a day (BID) | ORAL | Status: DC
  Administered 2015-01-05 – 2015-01-07 (×4): 20 mg via ORAL
  Filled 2015-01-05 (×5): qty 1

## 2015-01-05 MED ORDER — OXYCODONE HCL 5 MG PO TABS
5.0000 mg | ORAL_TABLET | ORAL | Status: DC | PRN
Start: 2015-01-05 — End: 2015-01-07
  Administered 2015-01-05 – 2015-01-07 (×11): 10 mg via ORAL
  Filled 2015-01-05 (×11): qty 2

## 2015-01-05 MED ORDER — SALINE SPRAY 0.65 % NA SOLN
1.0000 | Freq: Every day | NASAL | Status: DC | PRN
  Filled 2015-01-05: qty 44

## 2015-01-05 MED ORDER — HYDROMORPHONE HCL 1 MG/ML IJ SOLN
INTRAMUSCULAR | Status: AC
  Filled 2015-01-05: qty 1

## 2015-01-05 MED ORDER — PHENYLEPHRINE HCL 10 MG/ML IJ SOLN
10.0000 mg | INTRAVENOUS | Status: DC | PRN
  Administered 2015-01-05: 20 ug/min via INTRAVENOUS

## 2015-01-05 MED ORDER — MEPERIDINE HCL 25 MG/ML IJ SOLN
12.5000 mg | Freq: Once | INTRAMUSCULAR | Status: AC
  Administered 2015-01-05: 12.5 mg via INTRAVENOUS

## 2015-01-05 MED ORDER — FENTANYL CITRATE (PF) 100 MCG/2ML IJ SOLN
INTRAMUSCULAR | Status: DC | PRN
  Administered 2015-01-05: 50 ug via INTRAVENOUS

## 2015-01-05 MED ORDER — PHENOL 1.4 % MT LIQD: 1.0000 | OROMUCOSAL | Status: DC | PRN

## 2015-01-05 MED ORDER — STERILE WATER FOR INJECTION IJ SOLN
INTRAMUSCULAR | Status: AC
  Filled 2015-01-05: qty 10

## 2015-01-05 MED ORDER — MIDAZOLAM HCL 2 MG/2ML IJ SOLN
INTRAMUSCULAR | Status: AC
  Filled 2015-01-05: qty 2

## 2015-01-05 MED ORDER — FENTANYL CITRATE (PF) 250 MCG/5ML IJ SOLN
INTRAMUSCULAR | Status: AC
  Filled 2015-01-05: qty 5

## 2015-01-05 MED ORDER — DEXAMETHASONE SODIUM PHOSPHATE 4 MG/ML IJ SOLN
INTRAMUSCULAR | Status: DC | PRN
  Administered 2015-01-05: 4 mg via INTRAVENOUS

## 2015-01-05 MED ORDER — BISACODYL 5 MG PO TBEC: 5.0000 mg | DELAYED_RELEASE_TABLET | Freq: Every day | ORAL | Status: DC | PRN

## 2015-01-05 MED ORDER — SENNOSIDES-DOCUSATE SODIUM 8.6-50 MG PO TABS: 1.0000 | ORAL_TABLET | Freq: Every evening | ORAL | Status: DC | PRN

## 2015-01-05 MED ORDER — LIDOCAINE HCL (CARDIAC) 20 MG/ML IV SOLN
INTRAVENOUS | Status: AC
  Filled 2015-01-05: qty 5

## 2015-01-05 MED ORDER — CEFAZOLIN SODIUM-DEXTROSE 2-3 GM-% IV SOLR
2.0000 g | Freq: Four times a day (QID) | INTRAVENOUS | Status: AC
  Administered 2015-01-05 (×2): 2 g via INTRAVENOUS
  Filled 2015-01-05 (×3): qty 50

## 2015-01-05 MED ORDER — BUPIVACAINE IN DEXTROSE 0.75-8.25 % IT SOLN
INTRATHECAL | Status: DC | PRN
  Administered 2015-01-05: 15 mg via INTRATHECAL

## 2015-01-05 MED ORDER — DIPHENHYDRAMINE HCL 12.5 MG/5ML PO ELIX: 12.5000 mg | ORAL_SOLUTION | ORAL | Status: DC | PRN

## 2015-01-05 MED ORDER — MIDAZOLAM HCL 5 MG/5ML IJ SOLN
INTRAMUSCULAR | Status: DC | PRN
  Administered 2015-01-05: 2 mg via INTRAVENOUS

## 2015-01-05 MED ORDER — ALBUMIN HUMAN 5 % IV SOLN
INTRAVENOUS | Status: DC | PRN
  Administered 2015-01-05: 11:00:00 via INTRAVENOUS

## 2015-01-05 MED ORDER — LORATADINE 10 MG PO TABS
10.0000 mg | ORAL_TABLET | Freq: Every day | ORAL | Status: DC
  Administered 2015-01-06 – 2015-01-07 (×2): 10 mg via ORAL
  Filled 2015-01-05 (×2): qty 1

## 2015-01-05 MED ORDER — SERTRALINE HCL 100 MG PO TABS
100.0000 mg | ORAL_TABLET | Freq: Every day | ORAL | Status: DC
  Administered 2015-01-06 – 2015-01-07 (×2): 100 mg via ORAL
  Filled 2015-01-05 (×2): qty 1

## 2015-01-05 MED ORDER — PROMETHAZINE HCL 25 MG/ML IJ SOLN: 6.2500 mg | INTRAMUSCULAR | Status: DC | PRN

## 2015-01-05 MED ORDER — SODIUM CHLORIDE 0.9 % IR SOLN
Status: DC | PRN
  Administered 2015-01-05: 1000 mL

## 2015-01-05 MED ORDER — METOCLOPRAMIDE HCL 5 MG PO TABS
5.0000 mg | ORAL_TABLET | Freq: Three times a day (TID) | ORAL | Status: DC | PRN
Start: 2015-01-05 — End: 2015-01-07

## 2015-01-05 MED ORDER — LACTATED RINGERS IV SOLN
INTRAVENOUS | Status: DC | PRN
  Administered 2015-01-05 (×3): via INTRAVENOUS

## 2015-01-05 MED ORDER — FLEET ENEMA 7-19 GM/118ML RE ENEM: 1.0000 | ENEMA | Freq: Once | RECTAL | Status: AC | PRN

## 2015-01-05 MED ORDER — METOCLOPRAMIDE HCL 5 MG/ML IJ SOLN: 5.0000 mg | Freq: Three times a day (TID) | INTRAMUSCULAR | Status: DC | PRN

## 2015-01-05 MED ORDER — METHOCARBAMOL 500 MG PO TABS
500.0000 mg | ORAL_TABLET | Freq: Four times a day (QID) | ORAL | Status: DC | PRN
  Administered 2015-01-06: 500 mg via ORAL
  Filled 2015-01-05 (×3): qty 1

## 2015-01-05 MED ORDER — BUPIVACAINE-EPINEPHRINE (PF) 0.25% -1:200000 IJ SOLN
INTRAMUSCULAR | Status: DC | PRN
  Administered 2015-01-05: 30 mL

## 2015-01-05 MED ORDER — PHENYLEPHRINE 40 MCG/ML (10ML) SYRINGE FOR IV PUSH (FOR BLOOD PRESSURE SUPPORT)
PREFILLED_SYRINGE | INTRAVENOUS | Status: AC
  Filled 2015-01-05: qty 10

## 2015-01-05 MED ORDER — ONDANSETRON HCL 4 MG/2ML IJ SOLN
INTRAMUSCULAR | Status: AC
  Filled 2015-01-05: qty 2

## 2015-01-05 MED ORDER — ONDANSETRON HCL 4 MG/2ML IJ SOLN
4.0000 mg | Freq: Four times a day (QID) | INTRAMUSCULAR | Status: DC | PRN
  Administered 2015-01-05: 4 mg via INTRAVENOUS
  Filled 2015-01-05: qty 2

## 2015-01-05 MED ORDER — ALBUTEROL SULFATE HFA 108 (90 BASE) MCG/ACT IN AERS
INHALATION_SPRAY | RESPIRATORY_TRACT | Status: DC | PRN
  Administered 2015-01-05: 2 via RESPIRATORY_TRACT

## 2015-01-05 MED ORDER — ONDANSETRON HCL 4 MG/2ML IJ SOLN
INTRAMUSCULAR | Status: DC | PRN
Start: 2015-01-05 — End: 2015-01-05
  Administered 2015-01-05: 4 mg via INTRAVENOUS

## 2015-01-05 MED ORDER — RIVAROXABAN 10 MG PO TABS
10.0000 mg | ORAL_TABLET | Freq: Every day | ORAL | Status: DC
  Administered 2015-01-06 – 2015-01-07 (×2): 10 mg via ORAL
  Filled 2015-01-05 (×2): qty 1

## 2015-01-05 MED ORDER — LACTATED RINGERS IV SOLN
INTRAVENOUS | Status: DC
  Administered 2015-01-05: 09:00:00 via INTRAVENOUS

## 2015-01-05 MED ORDER — BUPIVACAINE-EPINEPHRINE (PF) 0.25% -1:200000 IJ SOLN
INTRAMUSCULAR | Status: AC
  Filled 2015-01-05: qty 30

## 2015-01-05 MED ORDER — ACETAMINOPHEN 10 MG/ML IV SOLN
1000.0000 mg | Freq: Four times a day (QID) | INTRAVENOUS | Status: DC
  Administered 2015-01-05 – 2015-01-06 (×4): 1000 mg via INTRAVENOUS
  Filled 2015-01-05 (×4): qty 100

## 2015-01-05 MED ORDER — MEPERIDINE HCL 25 MG/ML IJ SOLN
INTRAMUSCULAR | Status: AC
  Filled 2015-01-05: qty 1

## 2015-01-05 MED ORDER — PROPOFOL 10 MG/ML IV BOLUS
INTRAVENOUS | Status: DC | PRN
  Administered 2015-01-05: 20 mg via INTRAVENOUS

## 2015-01-05 MED ORDER — KETOROLAC TROMETHAMINE 15 MG/ML IJ SOLN
7.5000 mg | Freq: Four times a day (QID) | INTRAMUSCULAR | Status: DC
  Administered 2015-01-05 – 2015-01-06 (×3): 7.5 mg via INTRAVENOUS
  Filled 2015-01-05 (×3): qty 1

## 2015-01-05 MED ORDER — SODIUM CHLORIDE 0.9 % IR SOLN
Status: DC | PRN
  Administered 2015-01-05: 3000 mL

## 2015-01-05 MED ORDER — PHENYLEPHRINE HCL 10 MG/ML IJ SOLN
INTRAMUSCULAR | Status: DC | PRN
  Administered 2015-01-05 (×2): 80 ug via INTRAVENOUS
  Administered 2015-01-05: 40 ug via INTRAVENOUS

## 2015-01-05 MED ORDER — PROPOFOL INFUSION 10 MG/ML OPTIME
INTRAVENOUS | Status: DC | PRN
  Administered 2015-01-05: 75 ug/kg/min via INTRAVENOUS
  Administered 2015-01-05: 11:00:00 via INTRAVENOUS

## 2015-01-05 MED ORDER — SODIUM CHLORIDE 0.9 % IV SOLN: INTRAVENOUS | Status: DC

## 2015-01-05 MED ORDER — VECURONIUM BROMIDE 10 MG IV SOLR
INTRAVENOUS | Status: AC
  Filled 2015-01-05: qty 10

## 2015-01-05 MED ORDER — MOMETASONE FURO-FORMOTEROL FUM 100-5 MCG/ACT IN AERO
2.0000 | INHALATION_SPRAY | Freq: Two times a day (BID) | RESPIRATORY_TRACT | Status: DC
  Administered 2015-01-06: 2 via RESPIRATORY_TRACT
  Filled 2015-01-05: qty 8.8

## 2015-01-05 MED ORDER — DEXTROSE 5 % IV SOLN
500.0000 mg | INTRAVENOUS | Status: AC
  Administered 2015-01-05: 500 mg via INTRAVENOUS
  Filled 2015-01-05: qty 5

## 2015-01-05 MED ORDER — HYDROMORPHONE HCL 1 MG/ML IJ SOLN
1.0000 mg | INTRAMUSCULAR | Status: DC | PRN
  Administered 2015-01-06: 1 mg via INTRAVENOUS
  Filled 2015-01-05: qty 1

## 2015-01-05 MED ORDER — HYDROMORPHONE HCL 1 MG/ML IJ SOLN
0.2500 mg | INTRAMUSCULAR | Status: DC | PRN
  Administered 2015-01-05: 0.5 mg via INTRAVENOUS

## 2015-01-05 MED ORDER — METHOCARBAMOL 1000 MG/10ML IJ SOLN
500.0000 mg | Freq: Four times a day (QID) | INTRAVENOUS | Status: DC | PRN
  Filled 2015-01-05: qty 5

## 2015-01-05 MED ORDER — MENTHOL 3 MG MT LOZG: 1.0000 | LOZENGE | OROMUCOSAL | Status: DC | PRN

## 2015-01-05 SURGICAL SUPPLY — 55 items
BLADE SAW SAG 73X25 THK (BLADE) ×2
BLADE SAW SGTL 73X25 THK (BLADE) ×1 IMPLANT
BRUSH FEMORAL CANAL (MISCELLANEOUS) IMPLANT
CAPT HIP TOTAL 2 ×3 IMPLANT
COVER SURGICAL LIGHT HANDLE (MISCELLANEOUS) ×3 IMPLANT
DRAPE INCISE IOBAN 66X45 STRL (DRAPES) IMPLANT
DRAPE ORTHO SPLIT 77X108 STRL (DRAPES) ×4
DRAPE SURG ORHT 6 SPLT 77X108 (DRAPES) ×2 IMPLANT
DRSG MEPILEX BORDER 4X12 (GAUZE/BANDAGES/DRESSINGS) IMPLANT
DRSG MEPILEX BORDER 4X8 (GAUZE/BANDAGES/DRESSINGS) ×3 IMPLANT
DURAPREP 26ML APPLICATOR (WOUND CARE) ×6 IMPLANT
ELECT BLADE 6.5 EXT (BLADE) ×3 IMPLANT
ELECT REM PT RETURN 9FT ADLT (ELECTROSURGICAL) ×3
ELECTRODE REM PT RTRN 9FT ADLT (ELECTROSURGICAL) ×1 IMPLANT
ELIMINATOR HOLE APEX DEPUY (Hips) IMPLANT
EVACUATOR 1/8 PVC DRAIN (DRAIN) IMPLANT
FACESHIELD WRAPAROUND (MASK) ×9 IMPLANT
GLOVE BIOGEL PI IND STRL 8 (GLOVE) ×1 IMPLANT
GLOVE BIOGEL PI IND STRL 8.5 (GLOVE) ×1 IMPLANT
GLOVE BIOGEL PI INDICATOR 8 (GLOVE) ×2
GLOVE BIOGEL PI INDICATOR 8.5 (GLOVE) ×2
GLOVE ECLIPSE 8.0 STRL XLNG CF (GLOVE) ×6 IMPLANT
GLOVE SURG ORTHO 8.5 STRL (GLOVE) ×6 IMPLANT
GOWN STRL REUS W/ TWL LRG LVL3 (GOWN DISPOSABLE) ×2 IMPLANT
GOWN STRL REUS W/TWL 2XL LVL3 (GOWN DISPOSABLE) ×3 IMPLANT
GOWN STRL REUS W/TWL LRG LVL3 (GOWN DISPOSABLE) ×4
HANDPIECE INTERPULSE COAX TIP (DISPOSABLE) ×2
IMMOBILIZER KNEE 20 (SOFTGOODS) IMPLANT
IMMOBILIZER KNEE 22 UNIV (SOFTGOODS) ×3 IMPLANT
KIT BASIN OR (CUSTOM PROCEDURE TRAY) ×3 IMPLANT
KIT ROOM TURNOVER OR (KITS) ×3 IMPLANT
MANIFOLD NEPTUNE II (INSTRUMENTS) ×3 IMPLANT
NEEDLE 22X1 1/2 (OR ONLY) (NEEDLE) ×3 IMPLANT
NS IRRIG 1000ML POUR BTL (IV SOLUTION) ×3 IMPLANT
PACK TOTAL JOINT (CUSTOM PROCEDURE TRAY) ×3 IMPLANT
PACK UNIVERSAL I (CUSTOM PROCEDURE TRAY) IMPLANT
PAD ARMBOARD 7.5X6 YLW CONV (MISCELLANEOUS) ×6 IMPLANT
PRESSURIZER FEMORAL UNIV (MISCELLANEOUS) IMPLANT
SET HNDPC FAN SPRY TIP SCT (DISPOSABLE) ×1 IMPLANT
STAPLER VISISTAT 35W (STAPLE) ×3 IMPLANT
SUCTION FRAZIER TIP 10 FR DISP (SUCTIONS) ×3 IMPLANT
SUT BONE WAX W31G (SUTURE) IMPLANT
SUT ETHIBOND NAB CT1 #1 30IN (SUTURE) ×9 IMPLANT
SUT MNCRL AB 3-0 PS2 18 (SUTURE) ×3 IMPLANT
SUT VIC AB 0 CT1 27 (SUTURE) ×4
SUT VIC AB 0 CT1 27XBRD ANBCTR (SUTURE) ×2 IMPLANT
SUT VIC AB 1 CT1 27 (SUTURE) ×4
SUT VIC AB 1 CT1 27XBRD ANBCTR (SUTURE) ×2 IMPLANT
SUT VIC AB 2-0 CT1 27 (SUTURE) ×2
SUT VIC AB 2-0 CT1 TAPERPNT 27 (SUTURE) ×1 IMPLANT
SYR CONTROL 10ML LL (SYRINGE) ×3 IMPLANT
TOWEL OR 17X24 6PK STRL BLUE (TOWEL DISPOSABLE) ×3 IMPLANT
TOWEL OR 17X26 10 PK STRL BLUE (TOWEL DISPOSABLE) ×3 IMPLANT
TOWER CARTRIDGE SMART MIX (DISPOSABLE) IMPLANT
WATER STERILE IRR 1000ML POUR (IV SOLUTION) IMPLANT

## 2015-01-05 NOTE — Anesthesia Postprocedure Evaluation (Signed)
  Anesthesia Post-op Note  Patient: Valerie Patterson  Procedure(s) Performed: Procedure(s): TOTAL HIP ARTHROPLASTY (Right)  Patient Location: PACU  Anesthesia Type: MAC, Spinal   Level of Consciousness: awake, alert  and oriented  Airway and Oxygen Therapy: Patient Spontanous Breathing  Post-op Pain: mild  Post-op Assessment: Post-op Vital signs reviewed  Post-op Vital Signs: Reviewed  Last Vitals:  Filed Vitals:   01/05/15 1447  BP: 104/45  Pulse: 70  Temp: 36.4 C  Resp:     Complications: No apparent anesthesia complications

## 2015-01-05 NOTE — Progress Notes (Signed)
Pt shivering extremely dt [place] called and med ordered and given

## 2015-01-05 NOTE — Progress Notes (Signed)
Utilization review completed.  

## 2015-01-05 NOTE — Anesthesia Procedure Notes (Addendum)
Spinal Patient location during procedure: OR Start time: 01/05/2015 10:01 AM End time: 01/05/2015 10:08 AM Staffing Anesthesiologist: Duane Boston Performed by: anesthesiologist  Preanesthetic Checklist Completed: patient identified, surgical consent, pre-op evaluation, timeout performed, IV checked, risks and benefits discussed and monitors and equipment checked Spinal Block Patient position: sitting Prep: DuraPrep Patient monitoring: cardiac monitor, continuous pulse ox and blood pressure Approach: midline Location: L2-3 Injection technique: single-shot Needle Needle type: Pencan  Needle gauge: 24 G Needle length: 9 cm Additional Notes Functioning IV was confirmed and monitors were applied. Sterile prep and drape, including hand hygiene and sterile gloves were used. The patient was positioned and the spine was prepped. The skin was anesthetized with lidocaine.  Free flow of clear CSF was obtained prior to injecting local anesthetic into the CSF.  The spinal needle aspirated freely following injection.  The needle was carefully withdrawn.  The patient tolerated the procedure well.   Procedure Name: MAC Date/Time: 01/05/2015 10:15 AM Performed by: Jenne Campus Pre-anesthesia Checklist: Patient identified, Emergency Drugs available, Suction available, Patient being monitored and Timeout performed Patient Re-evaluated:Patient Re-evaluated prior to inductionOxygen Delivery Method: Simple face mask

## 2015-01-05 NOTE — Progress Notes (Signed)
Orthopedic Tech Progress Note Patient Details:  Valerie Patterson 1950/09/19 [id number]  Patient ID: Valerie Patterson, female   DOB: Aug 26, 1950, 64 y.o.   MRN: [id number] RN stated that pt already has knee immobilizer  Valerie Patterson 01/05/2015, 3:03 PM

## 2015-01-05 NOTE — Care Management Note (Signed)
Case Management Note  Patient Details  Name: Valerie Patterson MRN: [id number] Date of Birth: 09-May-1951  Subjective/Objective:                   s/p   TOTAL HIP ARTHROPLASTY (Right)  Action/Plan: Home health PT and 3 in 1, patient says she already has a rolling walker at home.  Still awaits OT consult.  Patient says she will need a light weight W/C and grab stick - both need to be ordered.  Follow up needed with DME.  Expected Discharge Date:                  Expected Discharge Plan:  [facility]  In-House Referral:     Discharge planning Services  CM Consult  Post Acute Care Choice:    Choice offered to:  Patient, Spouse  DME Arranged:  3-N-1 DME Agency:  [place]:  PT [place]:  [place]  Status of Service:  In process, will continue to follow  Medicare Important Message Given:    Date Medicare IM Given:    Medicare IM give by:    Date Additional Medicare IM Given:    Additional Medicare Important Message give by:     If discussed at [place] of Stay Meetings, dates discussed:    Additional Comments:  Dimas Aguas, RN 01/05/2015, 8:56 PM

## 2015-01-05 NOTE — Transfer of Care (Signed)
Immediate Anesthesia Transfer of Care Note  Patient: Valerie Patterson  Procedure(s) Performed: Procedure(s): TOTAL HIP ARTHROPLASTY (Right)  Patient Location: PACU  Anesthesia Type:MAC and Spinal  Level of Consciousness: awake, oriented and patient cooperative  Airway & Oxygen Therapy: Patient Spontanous Breathing  Post-op Assessment: Report given to RN and Post -op Vital signs reviewed and stable  Post vital signs: Reviewed  Last Vitals:  Filed Vitals:   01/05/15 0801  BP: 131/52  Pulse: 77  Temp: 36.3 C  Resp: 20    Complications: No apparent anesthesia complications

## 2015-01-05 NOTE — Evaluation (Signed)
Physical Therapy Evaluation Patient Details Name: Valerie Patterson MRN: [id number] DOB: [birth year] Today's Date: 01/05/2015   History of Present Illness  Admitted for R THA; Posterior precautions, WBAT  Past Medical History  Diagnosis Date  . Asthma   . Bronchitis   . Hypercholesterolemia   . Wears glasses   . Eczema   . Depression   . Osteoarthritis of both hips   . Pneumonia   . Coarse tremors     pt stated from inhalers   Past Surgical History  Procedure Laterality Date  . Tonsillectomy    . Anal fissure repair    . Colonoscopy    . Incontinence surgery    . Varicose veins       Clinical Impression  Pt is s/p THA resulting in the deficits listed below (see PT Problem List).  Pt will benefit from skilled PT to increase their independence and safety with mobility to allow discharge to the venue listed below.      Follow Up Recommendations Home health PT;Supervision/Assistance - 24 hour    Equipment Recommendations  Rolling walker with 5" wheels;3in1 (PT) (may already have RW)    Recommendations for Other Services OT consult     Precautions / Restrictions Precautions Precautions: Posterior Hip;Fall Precaution Booklet Issued: Yes (comment) Precaution Comments: Educated pt and family in posterior hip precautions Required Braces or Orthoses: Knee Immobilizer - Right Restrictions Weight Bearing Restrictions: Yes RLE Weight Bearing: Non weight bearing      Mobility  Bed Mobility Overal bed mobility: Needs Assistance Bed Mobility: Supine to Sit     Supine to sit: Min assist     General bed mobility comments: Cues for hip prec; good bridge of hips to EOB  Transfers Overall transfer level: Needs assistance Equipment used: Rolling walker (2 wheeled) Transfers: Sit to/from Stand Sit to Stand: Min guard         General transfer comment: Cues for technique, safety, and hand placement; demo cues for post hip prec  Ambulation/Gait Ambulation/Gait  assistance: Min guard;+2 safety/equipment Ambulation Distance (Feet): 8 Feet Assistive device: Rolling walker (2 wheeled) Gait Pattern/deviations: Step-to pattern     General Gait Details: Cues for gait sequence and posture; cues also to self-monitor for activity tolerance; Good steps and use of rW for support  Stairs            Wheelchair Mobility    Modified Rankin (Stroke Patients Only)       Balance                                             Pertinent Vitals/Pain Pain Assessment: Faces Faces Pain Scale: Hurts even more Pain Location: R hip once in chair Pain Descriptors / Indicators: Aching;Grimacing Pain Intervention(s): Limited activity within patient's tolerance;Monitored during session;Repositioned (pt requesting nausea meds)    Home Living Family/patient expects to be discharged to:: Private residence Living Arrangements: Spouse/significant other Available Help at Discharge: Family;Available 24 hours/day Type of Home: House Home Access: Level entry (into bottom floor)     Home Layout: Two level;Full bath on main level (One of pt's goals is to get upstairs) Home Equipment: Walker - 2 wheels (will need to verify that she has a RW)      Prior Function Level of Independence: Independent               Hand  Dominance        Extremity/Trunk Assessment   Upper Extremity Assessment: Overall WFL for tasks assessed           Lower Extremity Assessment: RLE deficits/detail RLE Deficits / Details: Decr AROM and strength as expected potop    Cervical / Trunk Assessment: Normal  Communication   Communication: No difficulties  Cognition Arousal/Alertness: Awake/alert Behavior During Therapy: WFL for tasks assessed/performed Overall Cognitive Status: Within Functional Limits for tasks assessed                      General Comments      Exercises Total Joint Exercises Ankle Circles/Pumps: AROM;Both;10 reps Quad  Sets: AROM;Right;5 reps Gluteal Sets: AROM;Both;5 reps Towel Squeeze: AROM;Both;5 reps (with monitor for internal rotation) Heel Slides: AAROM;Right;5 reps Hip ABduction/ADduction: AROM;Right (2 rreps)      Assessment/Plan    PT Assessment Patient needs continued PT services  PT Diagnosis Difficulty walking;Acute pain   PT Problem List Decreased strength;Decreased range of motion;Decreased activity tolerance;Decreased balance;Decreased mobility;Decreased knowledge of use of DME;Decreased knowledge of precautions;Pain  PT Treatment Interventions DME instruction;Gait training;Stair training;Functional mobility training;Therapeutic activities;Therapeutic exercise;Patient/family education   PT Goals (Current goals can be found in the Care Plan section) Acute Rehab PT Goals Patient Stated Goal: to walk without pain PT Goal Formulation: With patient Time For Goal Achievement: 01/12/15 Potential to Achieve Goals: Good    Frequency 7X/week   Barriers to discharge        Co-evaluation               End of Session Equipment Utilized During Treatment: Gait belt Activity Tolerance: Patient tolerated treatment well Patient left: in chair;with call bell/phone within reach;with family/visitor present Nurse Communication: Mobility status (requesting nausea medication)         Time: 2993-7169 PT Time Calculation (min) (ACUTE ONLY): 35 min   Charges:   PT Evaluation $Initial PT Evaluation Tier I: 1 Procedure PT Treatments $Gait Training: 8-22 mins   PT G CodesQuin Hoop 01/05/2015, 4:03 PM  Roney Marion, PT  Acute Rehabilitation Services Pager [phone] Office [phone]

## 2015-01-05 NOTE — Progress Notes (Signed)
CM spoke with patient and spouse in patient's room.  Husband says he will provide supervision.  Patient says she already has a rolling walker at home.  Patient selected [facility].  CM telephoned Jeannetta Nap of [facility] at [phone] for HHPT.  CM also telephoned Drenda Freeze of [facility] for 3 in 1 at [phone], call went to the emergency supply, CM disconnects.  CM then telephoned Lilyan Gilford of [facility] at [phone] and left voice mail message to arrange for 3 in 1, also mentioned patient says she needs light weight wheelchair with patients weight at 149 lbs and also wants a grab stick. Explained in the message that patient does not have order for wheelchair and grab stick yet.  OT evaluation is still pending and CM follow-up is needed.

## 2015-01-05 NOTE — H&P (Signed)
  The recent History & Physical has been reviewed. I have personally examined the patient today. There is no interval change to the documented History & Physical. The patient would like to proceed with the procedure.  [name] W 01/05/2015,  9:35 AM

## 2015-01-05 NOTE — Anesthesia Preprocedure Evaluation (Addendum)
Anesthesia Evaluation  Patient identified by MRN, date of birth, ID band Patient awake    Reviewed: Allergy & Precautions, NPO status , Patient's Chart, lab work & pertinent test results  History of Anesthesia Complications Negative for: history of anesthetic complications  Airway Mallampati: II  TM Distance: >3 FB Neck ROM: Full    Dental  (+) Teeth Intact, Dental Advisory Given   Pulmonary asthma ,    Pulmonary exam normal       Cardiovascular negative cardio ROS Normal cardiovascular exam    Neuro/Psych Depression negative neurological ROS     GI/Hepatic negative GI ROS, Neg liver ROS,   Endo/Other  negative endocrine ROS  Renal/GU      Musculoskeletal   Abdominal   Peds  Hematology   Anesthesia Other Findings   Reproductive/Obstetrics                            Anesthesia Physical Anesthesia Plan  ASA: II  Anesthesia Plan: MAC and Spinal   Post-op Pain Management:    Induction: Intravenous  Airway Management Planned: Simple Face Mask  Additional Equipment:   Intra-op Plan:   Post-operative Plan:   Informed Consent: I have reviewed the patients History and Physical, chart, labs and discussed the procedure including the risks, benefits and alternatives for the proposed anesthesia with the patient or authorized representative who has indicated his/her understanding and acceptance.   Dental advisory given  Plan Discussed with: Anesthesiologist, CRNA and Surgeon  Anesthesia Plan Comments:        Anesthesia Quick Evaluation

## 2015-01-05 NOTE — Op Note (Signed)
PATIENT ID:      VICTORINA KABLE  MRN:     [id number] DOB/AGE:    July 16, 1951 / 64 y.o.       OPERATIVE REPORT    DATE OF PROCEDURE:  01/05/2015       PREOPERATIVE DIAGNOSIS:   RIGHT HIP AVASCULAR NECROSIS, OSTEOARTHRITIS                                                       Estimated body mass index is 27.7 kg/(m^2) as calculated from the following:   Height as of this encounter: 5' 1.5" (1.562 m).   Weight as of this encounter: 67.586 kg (149 lb).     POSTOPERATIVE DIAGNOSIS:   RIGHT HIP AVASCULAR NECROSIS, OSTEOARTHRITIS                                                                     Estimated body mass index is 27.7 kg/(m^2) as calculated from the following:   Height as of this encounter: 5' 1.5" (1.562 m).   Weight as of this encounter: 67.586 kg (149 lb).     PROCEDURE:  Procedure(s):RIGHT TOTAL HIP ARTHROPLASTY      SURGEON:  [name], MD    ASSISTANT:   Biagio Borg, PA-C   (Present and scrubbed throughout the case, critical for assistance with exposure, retraction, instrumentation, and closure.)          ANESTHESIA: spinal and IV sedation     DRAINS: none :      TOURNIQUET TIME: * No tourniquets in log *    COMPLICATIONS:  None   CONDITION:  stable  PROCEDURE IN DETAIL: [id number]   Quintana Canelo W 01/05/2015, 11:52 AM

## 2015-01-06 ENCOUNTER — Inpatient Hospital Stay (HOSPITAL_COMMUNITY): Payer: Medicare Other

## 2015-01-06 ENCOUNTER — Encounter (HOSPITAL_COMMUNITY): Payer: Self-pay | Admitting: Orthopaedic Surgery

## 2015-01-06 ENCOUNTER — Telehealth: Payer: Self-pay | Admitting: Internal Medicine

## 2015-01-06 DIAGNOSIS — J4531 Mild persistent asthma with (acute) exacerbation: Secondary | ICD-10-CM

## 2015-01-06 LAB — BASIC METABOLIC PANEL
Anion gap: 9 (ref 5–15)
BUN: 13 mg/dL (ref 6–20)
CO2: 25 mmol/L (ref 22–32)
Calcium: 8.4 mg/dL — ABNORMAL LOW (ref 8.9–10.3)
Chloride: 104 mmol/L (ref 101–111)
Creatinine, Ser: 0.64 mg/dL (ref 0.44–1.00)
GFR calc Af Amer: >60 mL/min (ref >60)
GFR calc non Af Amer: >60 mL/min (ref >60)
Glucose, Bld: 197 mg/dL — ABNORMAL HIGH (ref 65–99)
Potassium: 3.3 mmol/L — ABNORMAL LOW (ref 3.5–5.1)
SODIUM: 138 mmol/L (ref 135–145)

## 2015-01-06 LAB — CBC
HCT: 26.4 % — ABNORMAL LOW (ref 36.0–46.0)
Hemoglobin: 8.7 g/dL — ABNORMAL LOW (ref 12.0–15.0)
MCH: 28.2 pg (ref 26.0–34.0)
MCHC: 33 g/dL (ref 30.0–36.0)
MCV: 85.4 fL (ref 78.0–100.0)
PLATELETS: 236 10*3/uL (ref 150–400)
RBC: 3.09 MIL/uL — AB (ref 3.87–5.11)
RDW: 13.9 % (ref 11.5–15.5)
WBC: 14.5 10*3/uL — ABNORMAL HIGH (ref 4.0–10.5)

## 2015-01-06 MED ORDER — POTASSIUM CHLORIDE CRYS ER 20 MEQ PO TBCR
20.0000 meq | EXTENDED_RELEASE_TABLET | Freq: Once | ORAL | Status: AC
  Administered 2015-01-06: 20 meq via ORAL
  Filled 2015-01-06: qty 1

## 2015-01-06 MED ORDER — METHYLPREDNISOLONE SODIUM SUCC 40 MG IJ SOLR
30.0000 mg | Freq: Four times a day (QID) | INTRAMUSCULAR | Status: DC
Start: 2015-01-06 — End: 2015-01-07
  Administered 2015-01-06 (×2): 30 mg via INTRAVENOUS
  Filled 2015-01-06 (×10): qty 0.75

## 2015-01-06 MED ORDER — METHYLPREDNISOLONE SODIUM SUCC 125 MG IJ SOLR
INTRAMUSCULAR | Status: AC
  Administered 2015-01-06: 125 mg
  Filled 2015-01-06: qty 2

## 2015-01-06 MED ORDER — METHYLPREDNISOLONE SODIUM SUCC 125 MG IJ SOLR
INTRAMUSCULAR | Status: AC
  Filled 2015-01-06: qty 2

## 2015-01-06 MED ORDER — ALBUTEROL SULFATE (2.5 MG/3ML) 0.083% IN NEBU
2.5000 mg | INHALATION_SOLUTION | RESPIRATORY_TRACT | Status: DC | PRN
  Administered 2015-01-06: 2.5 mg via RESPIRATORY_TRACT
  Filled 2015-01-06: qty 3

## 2015-01-06 MED ORDER — ALPRAZOLAM 0.25 MG PO TABS
0.2500 mg | ORAL_TABLET | Freq: Three times a day (TID) | ORAL | Status: DC | PRN
  Administered 2015-01-06 – 2015-01-07 (×3): 0.25 mg via ORAL
  Filled 2015-01-06 (×3): qty 1

## 2015-01-06 MED ORDER — DIPHENHYDRAMINE HCL 50 MG/ML IJ SOLN
INTRAMUSCULAR | Status: AC
  Administered 2015-01-06: 50 mg
  Filled 2015-01-06: qty 1

## 2015-01-06 MED ORDER — EPINEPHRINE HCL 1 MG/ML IJ SOLN
1.0000 mg | Freq: Once | INTRAMUSCULAR | Status: AC
  Administered 2015-01-06: 1 mg via INTRAMUSCULAR
  Filled 2015-01-06: qty 1

## 2015-01-06 MED ORDER — EPINEPHRINE 0.3 MG/0.3ML IJ SOAJ: 0.3000 mg | Freq: Once | INTRAMUSCULAR | Status: DC

## 2015-01-06 MED ORDER — DIPHENHYDRAMINE HCL 12.5 MG/5ML PO ELIX: 50.0000 mg | ORAL_SOLUTION | Freq: Three times a day (TID) | ORAL | Status: DC | PRN

## 2015-01-06 MED ORDER — ALBUTEROL SULFATE HFA 108 (90 BASE) MCG/ACT IN AERS
2.0000 | INHALATION_SPRAY | RESPIRATORY_TRACT | Status: DC
Start: 2015-01-06 — End: 2015-01-06

## 2015-01-06 NOTE — Discharge Instructions (Signed)
Information on my medicine - XARELTO® (Rivaroxaban) ° °This medication education was reviewed with me or my healthcare representative as part of my discharge preparation.  The pharmacist that spoke with me during my hospital stay was:  Gerianne Simonet Kay, RPH ° °Why was Xarelto® prescribed for you? °Xarelto® was prescribed for you to reduce the risk of blood clots forming after orthopedic surgery. The medical term for these abnormal blood clots is venous thromboembolism (VTE). ° °What do you need to know about xarelto® ? °Take your Xarelto® ONCE DAILY at the same time every day. °You may take it either with or without food. ° °If you have difficulty swallowing the tablet whole, you may crush it and mix in applesauce just prior to taking your dose. ° °Take Xarelto® exactly as prescribed by your doctor and DO NOT stop taking Xarelto® without talking to the doctor who prescribed the medication.  Stopping without other VTE prevention medication to take the place of Xarelto® may increase your risk of developing a clot. ° °After discharge, you should have regular check-up appointments with your healthcare provider that is prescribing your Xarelto®.   ° °What do you do if you miss a dose? °If you miss a dose, take it as soon as you remember on the same day then continue your regularly scheduled once daily regimen the next day. Do not take two doses of Xarelto® on the same day.  ° °Important Safety Information °A possible side effect of Xarelto® is bleeding. You should call your healthcare provider right away if you experience any of the following: °? Bleeding from an injury or your nose that does not stop. °? Unusual colored urine (red or dark brown) or unusual colored stools (red or black). °? Unusual bruising for unknown reasons. °? A serious fall or if you hit your head (even if there is no bleeding). ° °Some medicines may interact with Xarelto® and might increase your risk of bleeding while on Xarelto®. To help avoid  this, consult your healthcare provider or pharmacist prior to using any new prescription or non-prescription medications, including herbals, vitamins, non-steroidal anti-inflammatory drugs (NSAIDs) and supplements. ° °This website has more information on Xarelto®: www.xarelto.com. ° ° °

## 2015-01-06 NOTE — Progress Notes (Signed)
Patient ID: Valerie Patterson, female   DOB: [birth year], 64 y.o.   MRN: [id number] PATIENT ID: Valerie Patterson        MRN:  [id number]          DOB/AGE: 03-03-1951 / 64 y.o.    Valerie Fears, MD   Valerie Borg, PA-C [address] [place], [place]  [address]                             [phone]   PROGRESS NOTE  Subjective:  negative for Chest Pain  negative for Shortness of Breath Now... Asthma attack early this morning....resolved  negative for Nausea/Vomiting   negative for Calf Pain    Tolerating Diet: yes         Patient reports pain as mild.     Feels better this AM, minimal pain. No SOB now  Objective: Vital signs in last 24 hours:   Patient Vitals for the past 24 hrs:  BP Temp Temp src Pulse Resp SpO2 Height Weight  01/06/15 0541 (!) 105/36 mmHg 98.2 F (36.8 C) - 88 18 99 % - -  01/06/15 0350 (!) 102/41 mmHg - - 84 - 97 % - -  01/06/15 0311 - - - - - 100 % - -  01/06/15 0303 (!) 108/31 mmHg - - 64 - 100 % - -  01/06/15 0252 - - - - - 100 % - -  01/06/15 0245 (!) 98/50 mmHg 97.8 F (36.6 C) Oral 68 (!) 24 (!) 88 % - -  01/06/15 0200 (!) 90/35 mmHg 97.9 F (36.6 C) - 62 18 95 % - -  01/05/15 2106 (!) 96/36 mmHg 98.3 F (36.8 C) Oral 65 18 96 % - -  01/05/15 1447 (!) 104/45 mmHg 97.5 F (36.4 C) Oral 70 - 98 % - -  01/05/15 1415 (!) 101/54 mmHg 98.2 F (36.8 C) - 72 - 95 % - -  01/05/15 1400 (!) 98/44 mmHg - - 78 - - - -  01/05/15 1345 (!) 102/51 mmHg - - 78 - 97 % - -  01/05/15 1330 (!) 101/43 mmHg - - 74 - - - -  01/05/15 1315 (!) 100/47 mmHg - - 73 - 100 % - -  01/05/15 1300 (!) 100/46 mmHg - - 68 18 - - -  01/05/15 1245 (!) 133/92 mmHg - - 66 (!) 24 100 % - -  01/05/15 1230 (!) 105/41 mmHg - - 75 20 - - -  01/05/15 1221 (!) 110/91 mmHg 97.4 F (36.3 C) - 77 20 100 % - -  01/05/15 0832 - - - - - - 5' 1.5" (1.562 m) -  01/05/15 0801 (!) 131/52 mmHg 97.4 F (36.3 C) Oral 77 20 97 % - 67.586 kg (149 lb)      Intake/Output from previous day:   06/21  0701 - 06/22 0700 In: 2250 [I.V.:2000] Out: 1425 [Urine:825]   Intake/Output this shift:       Intake/Output      06/21 0701 - 06/22 0700 06/22 0701 - 06/23 0700   I.V. (mL/kg) 2000 (29.6)    IV Piggyback 250    Total Intake(mL/kg) 2250 (33.3)    Urine (mL/kg/hr) 825    Blood 600    Total Output 1425     Net +825             LABORATORY DATA:  Recent Labs  01/06/15 0535  WBC 14.5*  HGB 8.7*  HCT 26.4*  PLT 236    Recent Labs  01/06/15 0535  NA 138  K 3.3*  CL 104  CO2 25  BUN 13  CREATININE 0.64  GLUCOSE 197*  CALCIUM 8.4*   Lab Results  Component Value Date   INR 1.04 12/02/2014    Recent Radiographic Studies :  Dg Pelvis Portable  01/05/2015   CLINICAL DATA:  Status post right total hip arthroplasty.  EXAM: PORTABLE PELVIS 1-2 VIEWS  COMPARISON:  None.  FINDINGS: Right total hip arthroplasty identified without complicating features.  The prosthesis appears located on this single view.  No fracture or dislocation identified.  IMPRESSION: Right total hip arthroplasty without complicating features.   Electronically Signed   By: Margarette Canada M.D.   On: 01/05/2015 12:52   Dg Chest Port 1 View  01/06/2015   CLINICAL DATA:  Acute onset of respiratory distress. Initial encounter.  EXAM: PORTABLE CHEST - 1 VIEW  COMPARISON:  Chest radiograph from 12/02/2014  FINDINGS: The lungs are well-aerated and clear. There is no evidence of focal opacification, pleural effusion or pneumothorax.  The cardiomediastinal silhouette is mildly enlarged. No acute osseous abnormalities are seen.  IMPRESSION: Mild cardiomegaly; no acute cardiopulmonary process seen.   Electronically Signed   By: Garald Balding M.D.   On: 01/06/2015 03:46     Examination:  General appearance: alert, cooperative and mild distress Resp: clear to auscultation bilaterally Cardio: regular rate and rhythm GI: normal findings: bowel sounds normal  Wound Exam: clean, dry, intact   Drainage:  Scant/small  amount Serosanguinous exudate  Motor Exam: EHL, FHL, Anterior Tibial and Posterior Tibial Intact  Sensory Exam: Superficial Peroneal, Deep Peroneal and Tibial normal  Vascular Exam: Right posterior tibial artery has 1+ (weak) pulse  Assessment:    1 Day Post-Op  Procedure(s) (LRB): TOTAL HIP ARTHROPLASTY (Right)  ADDITIONAL DIAGNOSIS:  Principal Problem:   Primary osteoarthritis of right hip Active Problems:   S/P total hip arthroplasty  Asthma exacerbation secondary to dilaudid   Plan: Physical Therapy as ordered Weight Bearing as Tolerated (WBAT)  DVT Prophylaxis:  Xarelto, Foot Pumps and TED hose  DISCHARGE PLAN: Home  DISCHARGE NEEDS: HHPT, Walker and 3-in-1 comode seat  Will consult Dr Melvyn Novas to see her       [facility]  01/06/2015 7:56 AM

## 2015-01-06 NOTE — Telephone Encounter (Signed)
I will see.

## 2015-01-06 NOTE — Progress Notes (Signed)
Physical Therapy Treatment Patient Details Name: Valerie Patterson MRN: [id number] DOB: [birth year] Today's Date: 01/06/2015    History of Present Illness Admitted for R THA; Posterior precautions, WBAT    PT Comments    Continues to make progress towards PT goals, ambulating up to 110 feet today with fairly heavy reliance on rolling walker for support. Tolerated stair training however, would benefit from one additional session prior to d/c from a PT standpoint. Patient will continue to benefit from skilled physical therapy services to further improve independence with functional mobility.   Follow Up Recommendations  Home health PT;Supervision/Assistance - 24 hour     Equipment Recommendations  Rolling walker with 5" wheels;3in1 (PT) (may already have RW)    Recommendations for Other Services OT consult     Precautions / Restrictions Precautions Precautions: Posterior Hip;Fall Precaution Comments: Reviewed posterior hip precautions. Recalls 1/3 Required Braces or Orthoses: Knee Immobilizer - Right Restrictions Weight Bearing Restrictions: Yes RLE Weight Bearing: Weight bearing as tolerated    Mobility  Bed Mobility Overal bed mobility: Needs Assistance Bed Mobility: Rolling;Sidelying to Sit Rolling: Min guard Sidelying to sit: Min guard       General bed mobility comments: Min guard for safety. Practiced rolling onto Lt side with pillow between LEs to block adduction. No physical assist needed.  Cues throughout for technique.  Transfers Overall transfer level: Needs assistance Equipment used: Rolling walker (2 wheeled) Transfers: Sit to/from Stand Sit to Stand: Min guard         General transfer comment: Close guard for safety. VC for hand placement. Some instability when initially bearing weight through RLE. No physical assist required from bed and recliner.  Ambulation/Gait Ambulation/Gait assistance: Min guard Ambulation Distance (Feet): 110 Feet Assistive  device: Rolling walker (2 wheeled) Gait Pattern/deviations: Step-through pattern;Decreased stride length;Decreased step length - left;Decreased stance time - right;Antalgic Gait velocity: slow   General Gait Details: Intermittent cues for forward gaze, and to increase WB as able through LEs as she was relying heavily on RW for support. Moderatly antalgic but no buckling or loss of balance noted. Small careful steps. Required 1 standing rest break to complete distanc.e   Stairs Stairs: Yes Stairs assistance: Min guard Stair Management: One rail Left;Step to pattern;Sideways Number of Stairs: 2 (x2) General stair comments: Practiced with single rail on Lt similar to home environment. Good control with ascent however close guard for safety while descending steps with less control. Reports increased Rt hip pain with descent.  Wheelchair Mobility    Modified Rankin (Stroke Patients Only)       Balance Overall balance assessment: Needs assistance Sitting-balance support: No upper extremity supported;Feet supported Sitting balance-Leahy Scale: Good     Standing balance support: No upper extremity supported Standing balance-Leahy Scale: Fair                      Cognition Arousal/Alertness: Awake/alert Behavior During Therapy: WFL for tasks assessed/performed Overall Cognitive Status: Within Functional Limits for tasks assessed                      Exercises      General Comments General comments (skin integrity, edema, etc.): husband present and observed gait and stair training.      Pertinent Vitals/Pain Pain Assessment: 0-10 Pain Score:  ("A bit better" no value given) Pain Location: Rt hip Pain Intervention(s): Monitored during session;Repositioned    Home Living  Prior Function            PT Goals (current goals can now be found in the care plan section) Acute Rehab PT Goals Patient Stated Goal: to walk without  pain PT Goal Formulation: With patient Time For Goal Achievement: 01/12/15 Potential to Achieve Goals: Good Progress towards PT goals: Progressing toward goals    Frequency  7X/week    PT Plan Current plan remains appropriate    Co-evaluation             End of Session Equipment Utilized During Treatment: Gait belt Activity Tolerance: Patient tolerated treatment well Patient left: in chair;with call bell/phone within reach;with family/visitor present     Time: 1749-1810 PT Time Calculation (min) (ACUTE ONLY): 21 min  Charges:  $Gait Training: 8-22 mins                    G Codes:      Ellouise Newer February 05, 2015, 6:45 PM Camille Bal [place], [place]

## 2015-01-06 NOTE — Consult Note (Signed)
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 She seems to have a history of mild persistent asthma with adult-onset around 2008. She had an exacerbation in November 2015 after raking leaves and then another attack around February. She saw my partner Wert for preop clearance PFTs 11/2014 showed FEV1 of 2.10-96% with ratio 68-suggesting mild airway obstruction, no significant response to albuterol. She developed sudden onset respiratory distress around 3 AM after receiving Dilaudid. Rapid response was called -she took 2 puffs of albuterol rescue inhaler prior to their arrival . Expiratory wheezing was heard .This resolved after an hour, with nebulizer treatment, she was also given 1 mg epinephrine, 50 mg Benadryl. She reports that this was not similar to her usual asthma attack    PAST MEDICAL HISTORY :   has a past medical history of Asthma; Bronchitis; Hypercholesterolemia; Wears glasses; Eczema; Depression; Osteoarthritis of both hips; Pneumonia; Coarse tremors; and GERD (gastroesophageal reflux disease).  has past surgical history that includes Tonsillectomy; Anal fissure repair; Colonoscopy; Incontinence surgery; varicose veins; and Total hip arthroplasty (Right, 01/04/2015). Prior to Admission medications   Medication Sig Start Date End Date Taking? Authorizing Provider  cetirizine (ZYRTEC) 10 MG tablet Take 10 mg by mouth daily.   Yes Historical Provider, MD  mometasone-formoterol (DULERA) 100-5 MCG/ACT AERO Take 2 puffs first thing in am and then another 2 puffs about 12 hours later. 12/18/14  Yes Tanda Rockers, MD  ranitidine (ZANTAC) 150 MG tablet One at bedtime Patient taking differently: Take 150-300 mg by mouth 2 (two) times  daily. 150 mg in the morning and 300 mg in the evening 12/18/14  Yes Tanda Rockers, MD  sertraline (ZOLOFT) 100 MG tablet Take 100 mg by mouth daily.   Yes Historical Provider, MD  sodium chloride (OCEAN) 0.65 % SOLN nasal spray Place 1 spray into both nostrils daily as needed for congestion.    Yes Historical Provider, MD  albuterol (PROAIR HFA) 108 (90 BASE) MCG/ACT inhaler 2 puffs every 4 hours as needed only  if your can't catch your breath Patient not taking: Reported on 12/24/2014 12/18/14   Tanda Rockers, MD  predniSONE (DELTASONE) 10 MG tablet Take  4 each am x 2 days,   2 each am x 2 days,  1 each am x 2 days and stop Patient not taking: Reported on 12/24/2014 12/18/14   Tanda Rockers, MD   Allergies  Allergen Reactions  . Dilaudid [Hydromorphone Hcl] Anaphylaxis  . Caffeine Other (See Comments)    Stomach upset (*pt states dr told her to avoid anything with caffeine*)  . Compazine [Prochlorperazine]     Drew mouth to one side  . Erythromycin Nausea And Vomiting  . Pravastatin     Muscle aches  . Sulfa Antibiotics Rash    FAMILY HISTORY:  family history includes Asthma in her sister; Cancer - Colon in her mother; Cancer - Prostate in her father; Diabetes in her father; Sleep apnea in her father. SOCIAL HISTORY:  reports that she has never smoked. She has never used smokeless tobacco. She reports that she drinks alcohol. She reports that she does not use illicit drugs.  REVIEW OF SYSTEMS:   Constitutional: Negative for fever, chills, weight loss, malaise/fatigue and diaphoresis.  HENT: Negative for hearing loss, ear pain, nosebleeds, congestion, sore throat, neck pain, tinnitus and ear discharge.   Eyes: Negative for blurred vision, double vision, photophobia, pain, discharge and redness.  Respiratory: Negative for cough, hemoptysis, sputum production, wheezing and stridor.   Cardiovascular: Negative for chest pain, palpitations, orthopnea, claudication, leg swelling and PND.    Gastrointestinal: Negative for heartburn, nausea, vomiting, abdominal pain, diarrhea, constipation, blood in stool and melena.  Genitourinary: Negative for dysuria, urgency, frequency, hematuria and flank pain.  Musculoskeletal: Negative for myalgias, back pain, joint pain and falls.  Skin: Negative for itching and rash.  Neurological: Negative for dizziness, tingling, tremors, sensory change, speech change, focal weakness, seizures, loss of consciousness, weakness and headaches.  Endo/Heme/Allergies: Negative for environmental allergies and polydipsia. Does not bruise/bleed easily.  SUBJECTIVE:   VITAL SIGNS: Temp:  [97.4 F (36.3 C)-98.3 F (36.8 C)] 98.2 F (36.8 C) (06/22 0541) Pulse Rate:  [62-88] 88 (06/22 0541) Resp:  [18-24] 18 (06/22 0541) BP: (90-133)/(31-92) 105/36 mmHg (06/22 0541) SpO2:  [88 %-100 %] 99 % (06/22 0541)  PHYSICAL EXAMINATION: Gen. Pleasant, well-nourished, in no distress, normal affect ENT - no lesions, no post nasal drip Neck: No JVD, no thyromegaly, no carotid bruits Lungs: no use of accessory muscles, no dullness to percussion, clear without rales or rhonchi  Cardiovascular: Rhythm regular, heart sounds  normal, no murmurs, no peripheral edema Abdomen: soft and non-tender, no hepatosplenomegaly, BS normal. Musculoskeletal: No deformities, no cyanosis or clubbing Neuro:  alert, non focal Skin:  Warm, no lesions/ rash    Recent Labs Lab 01/06/15 0535  NA 138  K 3.3*  CL 104  CO2 25  BUN 13  CREATININE 0.64  GLUCOSE 197*    Recent Labs Lab 01/06/15 0535  HGB 8.7*  HCT 26.4*  WBC 14.5*  PLT 236   Dg Pelvis Portable  01/05/2015   CLINICAL DATA:  Status post right total hip arthroplasty.  EXAM: PORTABLE PELVIS 1-2 VIEWS  COMPARISON:  None.  FINDINGS: Right total hip arthroplasty identified without complicating features.  The prosthesis appears located on this single view.  No fracture or dislocation identified.  IMPRESSION: Right total  hip arthroplasty without complicating features.   Electronically Signed   By: Margarette Canada M.D.   On: 01/05/2015 12:52   Dg Chest Port 1 View  01/06/2015   CLINICAL DATA:  Acute onset of respiratory distress. Initial encounter.  EXAM: PORTABLE CHEST - 1 VIEW  COMPARISON:  Chest radiograph from 12/02/2014  FINDINGS: The lungs are well-aerated and clear. There is no evidence of focal opacification, pleural effusion or pneumothorax.  The cardiomediastinal silhouette is mildly enlarged. No acute osseous abnormalities are seen.  IMPRESSION: Mild cardiomegaly; no acute cardiopulmonary process seen.   Electronically Signed   By: Garald Balding M.D.   On: 01/06/2015 03:46    ASSESSMENT / PLAN:  Mild persistent asthma acute respiratory distress - seems to have been related more to an allergic reaction rather than true asthma exacerbation , seemed to resolve within an hour with treatment -not clear whether albuterol helped her or steroids and epinephrine or a combination .Dilaudid has now been listed as an allergy .  Recommend -observation for another 24 hours   she does not have any bronchospasm currently -does not require steroids   would resume Dulera 100/5 twice daily on discharge and use albuterol for rescue as needed   we will arrange pulmonary outpatient follow-up   Kara Mead MD. FCCP. [facility] Pager [phone] If no response call [phone]    01/06/2015, 11:51 AM

## 2015-01-06 NOTE — Progress Notes (Signed)
Physical Therapy Treatment Patient Details Name: Valerie Patterson MRN: [id number] DOB: 1951/01/20 Today's Date: 01/06/2015    History of Present Illness Admitted for R THA; Posterior precautions, WBAT    PT Comments    Patient is progressing well towards physical therapy goals, ambulating slowly up to 100 feet with a rolling walker. Reviewed hip precautions and exercises. Plan for stair training this afternoon. Patient will continue to benefit from skilled physical therapy services to further improve independence with functional mobility.    Follow Up Recommendations  Home health PT;Supervision/Assistance - 24 hour     Equipment Recommendations  Rolling walker with 5" wheels;3in1 (PT) (may already have RW)    Recommendations for Other Services OT consult     Precautions / Restrictions Precautions Precautions: Posterior Hip;Fall Precaution Comments: Reviewed posterior hip precautions. Required Braces or Orthoses: Knee Immobilizer - Right Restrictions Weight Bearing Restrictions: Yes RLE Weight Bearing: Weight bearing as tolerated    Mobility  Bed Mobility Overal bed mobility: Needs Assistance Bed Mobility: Supine to Sit     Supine to sit: Supervision;HOB elevated     General bed mobility comments: Supervision for safety. VC for hip precautions   Transfers Overall transfer level: Needs assistance Equipment used: Rolling walker (2 wheeled) Transfers: Sit to/from Stand Sit to Stand: Min guard         General transfer comment: Close guard for safety. VC for hand placement. Some instability when initially bearing weight through RLE.  Ambulation/Gait Ambulation/Gait assistance: Min guard Ambulation Distance (Feet): 100 Feet Assistive device: Rolling walker (2 wheeled) Gait Pattern/deviations: Step-to pattern;Step-through pattern;Decreased step length - left;Decreased stance time - right;Antalgic;Trunk flexed;Narrow base of support Gait velocity: slow Gait velocity  interpretation: Below normal speed for age/gender General Gait Details: VC for upright posture, increased Lt step length, and glute activation in stance phase. Moderately antalgic pattern. Min guard for safety but no physical assist required this AM   Stairs            Wheelchair Mobility    Modified Rankin (Stroke Patients Only)       Balance                                    Cognition Arousal/Alertness: Awake/alert Behavior During Therapy: WFL for tasks assessed/performed Overall Cognitive Status: Within Functional Limits for tasks assessed                      Exercises Total Joint Exercises Ankle Circles/Pumps: AROM;Both;10 reps Long Arc Quad: AROM;Right;10 reps;Seated    General Comments General comments (skin integrity, edema, etc.): Reviewed hip precautions and discussed safety with mobility.      Pertinent Vitals/Pain Pain Assessment: 0-10 Pain Score:  ("actually it's my left hip" no value given) Pain Location: states Lt hip worse than Rt hip Pain Descriptors / Indicators: Sore Pain Intervention(s): Monitored during session;Repositioned    Home Living                      Prior Function            PT Goals (current goals can now be found in the care plan section) Acute Rehab PT Goals Patient Stated Goal: to walk without pain PT Goal Formulation: With patient Time For Goal Achievement: 01/12/15 Potential to Achieve Goals: Good Progress towards PT goals: Progressing toward goals    Frequency  7X/week  PT Plan Current plan remains appropriate    Co-evaluation             End of Session Equipment Utilized During Treatment: Gait belt Activity Tolerance: Patient tolerated treatment well Patient left: in chair;with call bell/phone within reach;with family/visitor present     Time: 8485-9276 PT Time Calculation (min) (ACUTE ONLY): 29 min  Charges:  $Gait Training: 8-22 mins $Therapeutic Activity: 8-22  mins                    G Codes:      Ellouise Newer 2015-02-04, 9:32 AM Elayne Snare, [place]

## 2015-01-06 NOTE — Op Note (Signed)
NAMEJENEFER, WOERNER                ACCOUNT NO.:  [id number]  MEDICAL RECORD NO.:  [id number]  LOCATION:  5N10C                        FACILITY:  [facility]  PHYSICIAN:  Vonna Kotyk. 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  PROCEDURE:  Right total hip replacement.  SURGEON:  Vonna Kotyk. Durward Fortes, M.D.  ASSISTANT:  Biagio Borg PA-C, was present throughout the operative procedure to ensure its timely completion.  ANESTHESIA:  Spinal with IV sedation.  COMPLICATIONS:  None.  COMPONENTS:  DePuy AML large stature 12.5 mm femoral component, a 52 mm outer diameter section.  A Gription 3 metallic acetabular cup with a Marathon polyethylene liner +4 with a 10-degree posterior lip.  A 36-mm outer diameter hip ball with the -2 neck length.  Components were press- fit.  DESCRIPTION OF PROCEDURE:  Ms. Kerwood was met in the holding area, identified the right hip as the appropriate operative site, and answered any questions.  The patient was then transported to room #7, placed under spinal anesthesia without difficulty.  Nursing staff inserted a Foley catheter. Urine was clear.  The patient was then placed in the lateral decubitus position with the right side up with IV sedation.  The right lower extremity was prepped from the medial iliac crest to the ankle with chlorhexidine scrub and DuraPrep x2.  Sterile draping was performed.  Time-out was called.  A routine Southern incision was utilized and via sharp dissection, carried down to subcutaneous tissue.  There was abundant adipose tissue that was incised with the Bovie.  Any gross bleeders were Bovie coagulated.  Self-retaining retractors  were inserted.  The IT band was identified and incised along length of the skin incision and self- retaining retractors were placed more deeply.  The short external rotators were identified.  I did not see any of the tendons or muscle.  These were carefully incised from the attachment to the greater trochanter.  At that point, I could visualize the capsule. The capsule was incised along the femoral neck and head.  There was a small clear yellow joint effusion and pieces of articular cartilage, certainly consistent with her avascular necrosis.  The head was then dislocated posteriorly.  Retractors were carefully placed around the femoral neck.  I used a calcar guide to obtain the appropriate calcar angle for osteotomy.  The head was then osteotomized and delivered from the wound.  It was flattened and there were areas of avascular necrosis in the superior portion of the head and at least 80% of the head was devoid of articular cartilage.  Retractors were then carefully placed along the proximal shaft.  A starter hole was then made with the reamer.  The hand canal finder was then carefully inserted.  I carefully reamed from 8 to 12 mm to accept a 12 mm femoral component, but the neck was little bit long at that point, so then I removed about a quarter  of an inch.  We inserted the reamer and then rasping to the large stature 12 with a very nice fit flat on the calcar.  Retractors were then placed about the acetabulum.  There was still a loose piece of articular cartilage that removed and abundant synovitis that was also resected.  Retractors were placed about the acetabulum.  I did visualize the sciatic nerve and carefully and bluntly retracted it throughout the procedure.  The labrum was sharply excised.  The capsule was saved.  Reaming was performed sequentially to 51 mm outer diameter to accept a 52 component, I trialed the 50, it had excellent rim fit but would completely seat  the 52, had good rim fit but would not completely seat. At that point, I inserted the final Gription sector 3 acetabular component at 52 mm outer diameter with a really nice fit.  The wound was irrigated with saline solution.  I inserted the trialed polyethylene liner and then reinserted the large stature 12 femoral rasp.  We trialed several neck lengths and felt like the 36-mm outer diameter hip ball with a -2 neck length that gave [place] more  stability and to reestablish leg length.  She was too tight even with a +1 neck.  The trial components were removed, copiously irrigated the joint, and inserted an apex hole eliminator, screws were not necessary, and I then inserted the Marathon polyethylene liner +4 with a 10-degree lip.  Wound was again irrigated with saline solution.  Again protecting the sciatic nerve, the 12 mm large stature femoral component was then impacted and flushed on the calcar.  We cleaned the [facility] taper neck and inserted the 36-mm outer diameter hip ball with the -2 neck length and then carefully reduced the hip.  There was no toggling through a full range of motion, were perfectly stable.  We had full extension.  The patient was tight preoperatively and was approximately 3.8 to 0.5 inch short, and we felt we made up the difference.  The wound was then irrigated with saline solution.  I closed the capsule anatomically with #1 Ethibond.  Short external tear was closed with the same material.  The iliotibial band was closed with running #1 Vicryl.  The wound was then irrigated with saline solution. The adipose and fascia were closed with 2-0 Vicryl, 3-0 Monocryl, and skin clips.  Sterile bulky dressing was applied.  The patient was placed in the supine position and then transported to the operating room stretcher and then to the recovery room without incident.     Vonna Kotyk. Durward Fortes, M.D.     PWW/MEDQ  D:  01/05/2015  T:  01/05/2015  Job:  [id number]

## 2015-01-06 NOTE — Significant Event (Signed)
Rapid Response Event Note Call received per floor RN for Pt with respiratory distress following Dilaudid administration at 0230. Pt has history of asthma. Pt took 2 puffs of her own albuterol INH prior to my arrival with some improvement in respiratory distress  Overview: Time Called: 0301 Arrival Time: 0306 Event Type: Respiratory  Initial Focused Assessment: Pt found resting, alert oriented, denies pain. Mild SOB initally with 02 sats 100% on NRB, RR 25, lung sound clear in lower lobes, exp wheeze heard in upper lobes and upper airway. Within 15 minutes of my arrival Pt SOB worsened and wheezing worsened.  Pt unable to speak more than a few words at a time.   Interventions: Albuterol NEB given per RT. Dr. Louanne Skye on call paged, call back received and MD updated on Pt status. Benadry, Solumedrol, CXR, Epi Pen, and Xanax ordered stat. Benadry 50 mg IVP and solumedrol given Stat at 0330.   Pt with quick improvement as of 0340, RR 20s , lungs sound with minor upper airway wheezing heard, Epi injection given 0350.  As of 0440-Pt resting quietly, Xanax 0.25mg  and Oxy 10mg  tab for pain given at 0400. Pt denies pain, lung sounds improved with quiet wheeze heard in upper airway only. Pt left resting in bed quietly. RN advised to monitor closely and notify MD and myself for worsening changes. Floor RN to place Dilaudid in chart as Allergy.   Event Summary: Name of Physician Notified: Basil Dess MD at 0315    at    Outcome: Stayed in room and stabalized     [place], [place]

## 2015-01-06 NOTE — Progress Notes (Addendum)
Patient was experiencing an asthma attack.  I called respiratory, she did not have any medications on the [facility], so I paged the on call doctor, Dr. Louanne Skye.  Respiratory supervisor said for her to take 2 puffs of her Albuterol while I waited for orders.   He gave me the order for Albuterol Neb treatment.  I called respiratory again along with Jerene Pitch, RN to assess the patient as her condition had worsen.  She was given the Neb treatment and with continued monitoring more orders were given for the patient.  It was also felt that she was having a reaction to the Dilaudid that was given earlier.  She was given 1 mg Epinephrine, 50 mg of Benadryl, 0.25 Xanax, 10 mg Oxycodone along with 30 mg of Solumedrol; patient was put on 2L O2 for precaution.  A full note of events can also be viewed placed by Jerene Pitch, the Rapid RN.  At 4:45 AM, patient was finally resting comfortably.

## 2015-01-06 NOTE — Telephone Encounter (Signed)
Spoke with [place], PA with [facility]- Pt was given 1mg  Dilaudid-ended up having an asthma attack.  Pt is currently hospitalized for hip replacement, and is recommending a pulmonary provider to round on her after her asthma attack.    According to master schedule, RA is in [facility] today.  Forwarding this message and paging RA to see patient today if possible.  Thanks!

## 2015-01-06 NOTE — Progress Notes (Signed)
RT called by RN @ 3:00 because patient was exhibiting symptoms of an asthma attack. RT heard exp wheeze in the upper lobes and very prominent wheeze in the upper airway. RT administered an nebulized Albuterol treatment (2.5mg ) and patient was still having some wheezing and complaint that it felt like "throat was closing up on her".  Rapid Response and RN present and physician was called to correspond treatment.

## 2015-01-07 LAB — BASIC METABOLIC PANEL
ANION GAP: 9 (ref 5–15)
BUN: 11 mg/dL (ref 6–20)
CHLORIDE: 105 mmol/L (ref 101–111)
CO2: 26 mmol/L (ref 22–32)
Calcium: 9.1 mg/dL (ref 8.9–10.3)
Creatinine, Ser: 0.47 mg/dL (ref 0.44–1.00)
GFR calc non Af Amer: >60 mL/min (ref >60)
Glucose, Bld: 118 mg/dL — ABNORMAL HIGH (ref 65–99)
POTASSIUM: 4.4 mmol/L (ref 3.5–5.1)
Sodium: 140 mmol/L (ref 135–145)

## 2015-01-07 LAB — CBC
HEMATOCRIT: 24.5 % — AB (ref 36.0–46.0)
HEMOGLOBIN: 8 g/dL — AB (ref 12.0–15.0)
MCH: 28 pg (ref 26.0–34.0)
MCHC: 32.7 g/dL (ref 30.0–36.0)
MCV: 85.7 fL (ref 78.0–100.0)
PLATELETS: 184 10*3/uL (ref 150–400)
RBC: 2.86 MIL/uL — AB (ref 3.87–5.11)
RDW: 13.9 % (ref 11.5–15.5)
WBC: 9.7 10*3/uL (ref 4.0–10.5)

## 2015-01-07 MED ORDER — RIVAROXABAN 10 MG PO TABS: 10.0000 mg | ORAL_TABLET | Freq: Every day | ORAL | Status: DC

## 2015-01-07 MED ORDER — METHOCARBAMOL 500 MG PO TABS
500.0000 mg | ORAL_TABLET | Freq: Three times a day (TID) | ORAL | Status: DC | PRN
Start: 2015-01-07 — End: 2016-11-13

## 2015-01-07 MED ORDER — OXYCODONE HCL 5 MG PO TABS: 5.0000 mg | ORAL_TABLET | ORAL | Status: DC | PRN

## 2015-01-07 NOTE — Progress Notes (Signed)
Physical Therapy Treatment Patient Details Name: Valerie Patterson MRN: [id number] DOB: 09-29-1950 Today's Date: 01/07/2015    History of Present Illness Admitted for R THA; Posterior precautions, WBAT    PT Comments    Safely completed stair training. Gait speed improving, with no loss of balance or buckling noted during today's bout. Able to recall posterior hip precautions. Adequate for d/c from a mobility standpoint when medically ready. Pt has no further questions or concerns regarding mobility.  Follow Up Recommendations  Home health PT;Supervision/Assistance - 24 hour     Equipment Recommendations  Rolling walker with 5" wheels;3in1 (PT) (may already have RW)    Recommendations for Other Services OT consult     Precautions / Restrictions Precautions Precautions: Posterior Hip;Fall Precaution Comments: Reviewed posterior hip precautions. recalls 3/3 Required Braces or Orthoses: Knee Immobilizer - Right Restrictions Weight Bearing Restrictions: Yes RLE Weight Bearing: Weight bearing as tolerated    Mobility  Bed Mobility               General bed mobility comments: sitting in chair  Transfers Overall transfer level: Needs assistance Equipment used: Rolling walker (2 wheeled) Transfers: Sit to/from Stand Sit to Stand: Supervision         General transfer comment: supervision for safety. Good control with descent into chair. Slow to rise.  Ambulation/Gait Ambulation/Gait assistance: Supervision Ambulation Distance (Feet): 115 Feet Assistive device: Rolling walker (2 wheeled) Gait Pattern/deviations: Step-through pattern;Decreased step length - left;Decreased stance time - right;Antalgic Gait velocity: decreased   General Gait Details: Continues to progress well towards PT goals. Able to push RW continuously at times with step-through gait pattern. No loss of balance. Good foot clearance. Mildly antalgic. Improving gait speed   Stairs Stairs: Yes Stairs  assistance: Min guard Stair Management: One rail Left;Step to pattern;With walker Number of Stairs: 13 (3 with RW backwards) General stair comments: Trialed backwards approach with RW, min assist to block RW cues for technique. Ascends with supervision mildly antlagic. Min guard to descend, requires extra time. Tried holding single rail with both hands and with one hand on rail, other holding PTs hand for support. Feels she will be able to manage safely at home with family support.  Wheelchair Mobility    Modified Rankin (Stroke Patients Only)       Balance Overall balance assessment: Needs assistance         Standing balance support: Bilateral upper extremity supported;During functional activity Standing balance-Leahy Scale: Fair                      Cognition Arousal/Alertness: Awake/alert Behavior During Therapy: WFL for tasks assessed/performed Overall Cognitive Status: Within Functional Limits for tasks assessed                      Exercises Total Joint Exercises Ankle Circles/Pumps: AROM;Both;10 reps    General Comments General comments (skin integrity, edema, etc.): Spouse present for session.       Pertinent Vitals/Pain Pain Assessment: 0-10 Pain Score: 7  Pain Location: rt hip Pain Descriptors / Indicators: Aching Pain Intervention(s): Monitored during session;Repositioned    Home Living Family/patient expects to be discharged to:: Private residence Living Arrangements: Spouse/significant other Available Help at Discharge: Family;Available 24 hours/day Type of Home: House Home Access: Level entry   Home Layout: Two level;Full bath on main level Home Equipment: Walker - 2 wheels ((will need to verify that she has a RW)  Prior Function Level of Independence: Independent          PT Goals (current goals can now be found in the care plan section) Acute Rehab PT Goals Patient Stated Goal: to walk without pain PT Goal Formulation:  With patient Time For Goal Achievement: 01/12/15 Potential to Achieve Goals: Good Progress towards PT goals: Progressing toward goals    Frequency  7X/week    PT Plan Current plan remains appropriate    Co-evaluation             End of Session Equipment Utilized During Treatment: Gait belt Activity Tolerance: Patient tolerated treatment well Patient left: in chair;with call bell/phone within reach;with family/visitor present     Time: 5521-7471 PT Time Calculation (min) (ACUTE ONLY): 20 min  Charges:  $Gait Training: 8-22 mins                    G Codes:      Ellouise Newer 01/13/15, 10:38 AM Elayne Snare, [place]

## 2015-01-07 NOTE — Progress Notes (Signed)
Patient ID: Valerie Patterson, female   DOB: 03-Jul-1951, 64 y.o.   MRN: [id number] PATIENT ID: Valerie Patterson        MRN:  [id number]          DOB/AGE: 1950/12/15 / 64 y.o.    [name], MD   Biagio Borg, PA-C [address] [place],   [address]                             [phone]   PROGRESS NOTE  Subjective:  negative for Chest Pain  negative for Shortness of Breath  negative for Nausea/Vomiting   negative for Calf Pain    Tolerating Diet: yes         Patient reports pain as mild.     Comfortable night  Objective: Vital signs in last 24 hours:   Patient Vitals for the past 24 hrs:  BP Temp Temp src Pulse Resp SpO2  01/07/15 0600 (!) 108/47 mmHg 98.1 F (36.7 C) - 93 18 97 %  01/06/15 2055 (!) 117/39 mmHg 97.8 F (36.6 C) Oral 80 18 98 %  01/06/15 1332 (!) 111/43 mmHg 98.2 F (36.8 C) Oral 69 18 99 %      Intake/Output from previous day:   06/22 0701 - 06/23 0700 In: 680 [P.O.:680] Out: 1200 [Urine:1200]   Intake/Output this shift:       Intake/Output      06/22 0701 - 06/23 0700 06/23 0701 - 06/24 0700   P.O. 680    I.V. (mL/kg)     IV Piggyback     Total Intake(mL/kg) 680 (10.1)    Urine (mL/kg/hr) 1200 (0.7)    Blood     Total Output 1200     Net -520             LABORATORY DATA:  Recent Labs  01/06/15 0535 01/07/15 0336  WBC 14.5* 9.7  HGB 8.7* 8.0*  HCT 26.4* 24.5*  PLT 236 184    Recent Labs  01/06/15 0535 01/07/15 0336  NA 138 140  K 3.3* 4.4  CL 104 105  CO2 25 26  BUN 13 11  CREATININE 0.64 0.47  GLUCOSE 197* 118*  CALCIUM 8.4* 9.1   Lab Results  Component Value Date   INR 1.04 12/02/2014    Recent Radiographic Studies :  Dg Pelvis Portable  01/05/2015   CLINICAL DATA:  Status post right total hip arthroplasty.  EXAM: PORTABLE PELVIS 1-2 VIEWS  COMPARISON:  None.  FINDINGS: Right total hip arthroplasty identified without complicating features.  The prosthesis appears located on this single view.  No  fracture or dislocation identified.  IMPRESSION: Right total hip arthroplasty without complicating features.   Electronically Signed   By: Margarette Canada M.D.   On: 01/05/2015 12:52   Dg Chest Port 1 View  01/06/2015   CLINICAL DATA:  Acute onset of respiratory distress. Initial encounter.  EXAM: PORTABLE CHEST - 1 VIEW  COMPARISON:  Chest radiograph from 12/02/2014  FINDINGS: The lungs are well-aerated and clear. There is no evidence of focal opacification, pleural effusion or pneumothorax.  The cardiomediastinal silhouette is mildly enlarged. No acute osseous abnormalities are seen.  IMPRESSION: Mild cardiomegaly; no acute cardiopulmonary process seen.   Electronically Signed   By: Garald Balding M.D.   On: 01/06/2015 03:46     Examination:  General appearance: alert, cooperative and no distress  Wound Exam: clean, dry, intact  Drainage:  None: wound tissue dry  Motor Exam: EHL, FHL, Anterior Tibial and Posterior Tibial Intact  Sensory Exam: Superficial Peroneal, Deep Peroneal and Tibial normal  Vascular Exam: Normal  Assessment:    2 Days Post-Op  Procedure(s) (LRB): TOTAL HIP ARTHROPLASTY (Right)  ADDITIONAL DIAGNOSIS:  Principal Problem:   Primary osteoarthritis of right hip Active Problems:   S/P total hip arthroplasty  Acute Blood Loss Anemia-asymptomatic   Plan: Physical Therapy as ordered Weight Bearing as Tolerated (WBAT)  DVT Prophylaxis:  Xarelto and TED hose  DISCHARGE PLAN: Home  DISCHARGE NEEDS: HHPT, Walker and 3-in-1 comode seat    Right hip dressing changed-wound clean-will D/C today, excellent progress in PT    [facility], Crystall Donaldson W  01/07/2015 10:21 AM

## 2015-01-07 NOTE — Progress Notes (Signed)
D/C teachings done with pt and family. Both verbalize understanding and agree to comply. Pt d/c to home via wheelchair without incident accompanied by husband.

## 2015-01-07 NOTE — Progress Notes (Signed)
Occupational Therapy Evaluation Patient Details Name: Valerie Patterson MRN: [id number] DOB: 21-Nov-1950 Today's Date: 01/07/2015    History of Present Illness Admitted for R THA; Posterior precautions, WBAT   Clinical Impression   Pt admitted with the above diagnoses and presents with below problem list. Pt will benefit from continued acute OT to address the below listed deficits and maximize independence with BADLs prior to d/c home with spouse assisting. PTA pt was mod I with ADLs. Pt is currently min guard with LB ADLs and toilet/shower transfers. ADLs completed and education provided as detailed below.      Follow Up Recommendations  Supervision/Assistance - 24 hour;No OT follow up    Equipment Recommendations  3 in 1 bedside comode    Recommendations for Other Services       Precautions / Restrictions Precautions Precautions: Posterior Hip;Fall Precaution Comments: recalled 2/3; reviewed Required Braces or Orthoses:  (Pt and spouse report she has not needed to use KI.) Restrictions Weight Bearing Restrictions: Yes RLE Weight Bearing: Weight bearing as tolerated      Mobility Bed Mobility               General bed mobility comments: in recliner  Transfers Overall transfer level: Needs assistance Equipment used: Rolling walker (2 wheeled) Transfers: Sit to/from Stand Sit to Stand: Min guard         General transfer comment: good technique. min guard for safety. no physical assist.    Balance Overall balance assessment: Needs assistance         Standing balance support: Bilateral upper extremity supported;During functional activity Standing balance-Leahy Scale: Fair                              ADL Overall ADL's : Needs assistance/impaired Eating/Feeding: Set up;Sitting   Grooming: Set up;Min guard;Standing;Sitting   Upper Body Bathing: Set up;Sitting   Lower Body Bathing: Min guard;With adaptive equipment;Sit to/from stand    Upper Body Dressing : Set up;Sitting   Lower Body Dressing: Min guard;With adaptive equipment;Sit to/from stand   Toilet Transfer: Min guard;Ambulation;BSC;RW   Toileting- Water quality scientist and Hygiene: Min guard;With adaptive equipment;Sit to/from stand   Tub/ Shower Transfer: Tub transfer;Min guard;Ambulation;3 in 1 Tub/Shower Transfer Details (indicate cue type and reason): Practiced with simulated home setup. Functional mobility during ADLs: Min guard;Rolling walker General ADL Comments: Pt completed household distance ambulation at min guard level and practiced tub transfer with 3n1 at min guard level. ADL education provided to pt and spouse including AE and techniques for LB ADLs. Discussed safety with home setup.      Vision     Perception     Praxis      Pertinent Vitals/Pain Pain Assessment: 0-10 Pain Score:  ("it's not too bad") Pain Location: Rt hip Pain Descriptors / Indicators: Aching Pain Intervention(s): Limited activity within patient's tolerance;Monitored during session;Repositioned;Premedicated before session     Hand Dominance     Extremity/Trunk Assessment Upper Extremity Assessment Upper Extremity Assessment: Overall WFL for tasks assessed   Lower Extremity Assessment Lower Extremity Assessment: Defer to PT evaluation       Communication Communication Communication: No difficulties   Cognition Arousal/Alertness: Awake/alert Behavior During Therapy: WFL for tasks assessed/performed Overall Cognitive Status: Within Functional Limits for tasks assessed                     General Comments       Exercises  Shoulder Instructions      Home Living Family/patient expects to be discharged to:: Private residence Living Arrangements: Spouse/significant other Available Help at Discharge: Family;Available 24 hours/day Type of Home: House Home Access: Level entry     Home Layout: Two level;Full bath on main level Alternate  Level Stairs-Number of Steps: 16 Alternate Level Stairs-Rails: Left Bathroom Shower/Tub: Tub/shower unit Shower/tub characteristics: Architectural technologist: Standard Bathroom Accessibility: Yes How Accessible: Accessible via walker Home Equipment: Walker - 2 wheels ((will need to verify that she has a RW)          Prior Functioning/Environment Level of Independence: Independent             OT Diagnosis: Acute pain   OT Problem List: Impaired balance (sitting and/or standing);Decreased knowledge of use of DME or AE;Decreased knowledge of precautions;Pain   OT Treatment/Interventions: Self-care/ADL training;DME and/or AE instruction;Therapeutic activities;Patient/family education;Balance training    OT Goals(Current goals can be found in the care plan section) Acute Rehab OT Goals Patient Stated Goal: to walk without pain OT Goal Formulation: With patient/family Time For Goal Achievement: 01/14/15 Potential to Achieve Goals: Good ADL Goals Pt Will Perform Lower Body Dressing: with supervision;with adaptive equipment;sit to/from stand Additional ADL Goal #1: Pt will be mod I with bed mobility to prepare for OOB ADLs.   OT Frequency: Min 2X/week   Barriers to D/C:            Co-evaluation              End of Session Equipment Utilized During Treatment: Gait belt;Rolling walker  Activity Tolerance: Patient tolerated treatment well Patient left: in chair;with call bell/phone within reach;with family/visitor present   Time: 8676-1950 OT Time Calculation (min): 37 min Charges:  OT General Charges $OT Visit: 1 Procedure OT Evaluation $Initial OT Evaluation Tier I: 1 Procedure OT Treatments $Self Care/Home Management : 8-22 mins G-Codes:    Hortencia Pilar 01-27-2015, 9:54 AM

## 2015-01-07 NOTE — Discharge Summary (Signed)
[name], MD   Biagio Borg, PA-C [address], [place], [place]  [address]                             [phone]  PATIENT ID: Valerie Patterson        MRN:  [id number]          DOB/AGE: 12/28/1950 / 64 y.o.    DISCHARGE SUMMARY  ADMISSION DATE:    01/05/2015 DISCHARGE DATE:   01/07/2015   ADMISSION DIAGNOSIS: RIGHT HIP AVASCULAR NECROSIS, OSTEOARTHRITIS    DISCHARGE DIAGNOSIS:  RIGHT HIP AVASCULAR NECROSIS, OSTEOARTHRITIS    ADDITIONAL DIAGNOSIS: Principal Problem:   Primary osteoarthritis of right hip Active Problems:   S/P total hip arthroplasty  Past Medical History  Diagnosis Date  . Asthma   . Bronchitis   . Hypercholesterolemia   . Wears glasses   . Eczema   . Depression   . Osteoarthritis of both hips   . Pneumonia   . Coarse tremors     pt stated from inhalers  . GERD (gastroesophageal reflux disease)     PROCEDURE: Procedure(s): TOTAL HIP ARTHROPLASTY Right on 01/05/2015  CONSULTS: Pulmonology      HISTORY: Valerie Patterson is a very pleasant, 64 year old, white female who is seen today for evaluation of her right hip. She has had chronic problems with her right hip dating back to March 2015. At that time, there was somewhat of a radicular-type pain and pain in the anterior portion of her knee. There was a note that she did have, in the right hip, periarticular spurring and sclerosing and narrowing of the joint space when compared to the left hip. She has gone onto now have more pain and discomfort in the right hip. She only had about 40% benefit with an injection of her right knee back in April 2015. However, then a corticosteroid injection was given into the right hip which, for at least a month, did make quite a difference. Certainly, she does have arthritis in the knee and the right hip, but the right hip has gotten to the point now where she cannot even lay down flat without having the leg bent at the hip. She is now walking with a cane and  unfortunately is also having pain with every step in regards to her right hip and groin area. She comes in today for re-evaluation.  She is having problems with activities of daily living as well as pain with every step. She has night time pain, and she is unable to get comfortable at all with sitting, standing, or lying down. She has had a corticosteroid injection in the past which had been beneficial for only about a month.  HOSPITAL COURSE:  Valerie Patterson is a 64 y.o. admitted on 01/05/2015 and found to have a diagnosis of [place].  After appropriate laboratory studies were obtained  they were taken to the operating room on 01/05/2015 and underwent  Procedure(s): TOTAL HIP ARTHROPLASTY  Right.   They were given perioperative antibiotics:  Anti-infectives    Start     Dose/Rate Route Frequency Ordered Stop   01/05/15 1600  ceFAZolin (ANCEF) IVPB 2 g/50 mL premix     2 g 100 mL/hr over 30 Minutes Intravenous Every 6 hours 01/05/15 1440 01/05/15 2253   01/05/15 0900  ceFAZolin (ANCEF) IVPB 2 g/50 mL premix     2 g 100 mL/hr over 30  Minutes Intravenous To [facility] 01/04/15 1427 01/05/15 1015    .  Tolerated the procedure well.  Placed with a foley intraoperatively.    Toradol was given post op.  POD #1, allowed out of bed to a chair.  PT for ambulation and exercise program.  Foley D/C'd in morning.  IV saline locked.  O2 discontionued.  Had an apparently allergic reaction to DILAUDID.  POD #2, continued PT and ambulation.    The remainder of the hospital course was dedicated to ambulation and strengthening.   The patient was discharged on 2 Days Post-Op in  Stable condition.  Blood products given:none  DIAGNOSTIC STUDIES: Recent vital signs:  Patient Vitals for the past 24 hrs:  BP Temp Temp src Pulse Resp SpO2  01/07/15 0600 (!) 108/47 mmHg 98.1 F (36.7 C) - 93 18 97 %  01/06/15 2055 (!) 117/39 mmHg 97.8 F (36.6 C) Oral 80 18 98 %   01/06/15 1332 (!) 111/43 mmHg 98.2 F (36.8 C) Oral 69 18 99 %       Recent laboratory studies:  Recent Labs  01/06/15 0535 01/07/15 0336  WBC 14.5* 9.7  HGB 8.7* 8.0*  HCT 26.4* 24.5*  PLT 236 184    Recent Labs  01/06/15 0535 01/07/15 0336  NA 138 140  K 3.3* 4.4  CL 104 105  CO2 25 26  BUN 13 11  CREATININE 0.64 0.47  GLUCOSE 197* 118*  CALCIUM 8.4* 9.1   Lab Results  Component Value Date   INR 1.04 12/02/2014     Recent Radiographic Studies :  Dg Pelvis Portable  01/05/2015   CLINICAL DATA:  Status post right total hip arthroplasty.  EXAM: PORTABLE PELVIS 1-2 VIEWS  COMPARISON:  None.  FINDINGS: Right total hip arthroplasty identified without complicating features.  The prosthesis appears located on this single view.  No fracture or dislocation identified.  IMPRESSION: Right total hip arthroplasty without complicating features.   Electronically Signed   By: Margarette Canada M.D.   On: 01/05/2015 12:52   Dg Chest Port 1 View  01/06/2015   CLINICAL DATA:  Acute onset of respiratory distress. Initial encounter.  EXAM: PORTABLE CHEST - 1 VIEW  COMPARISON:  Chest radiograph from 12/02/2014  FINDINGS: The lungs are well-aerated and clear. There is no evidence of focal opacification, pleural effusion or pneumothorax.  The cardiomediastinal silhouette is mildly enlarged. No acute osseous abnormalities are seen.  IMPRESSION: Mild cardiomegaly; no acute cardiopulmonary process seen.   Electronically Signed   By: Garald Balding M.D.   On: 01/06/2015 03:46    DISCHARGE INSTRUCTIONS:     Discharge Instructions    Call MD / Call 911    Complete by:  As directed   If you experience chest pain or shortness of breath, CALL 911 and be transported to the hospital emergency room.  If you develope a fever above 101 F, pus (white drainage) or increased drainage or redness at the wound, or calf pain, call your surgeon's office.     Change dressing    Complete by:  As directed   DO NOT  CHANGE DRESSING. KEEP ON TILL SEEN IN THE OFFICE     Constipation Prevention    Complete by:  As directed   Drink plenty of fluids.  Prune juice may be helpful.  You may use a stool softener, such as Colace (over the counter) 100 mg twice a day.  Use MiraLax (over the counter) for constipation as needed.  Diet general    Complete by:  As directed      Discharge instructions    Complete by:  As directed   [place] items at home which could result in a fall. This includes throw rugs or furniture in walking pathways ICE to the affected joint every three hours while awake for 30 minutes at a time, for at least the first 3-5 days, and then as needed for pain and swelling.  Continue to use ice for pain and swelling. You may notice swelling that will progress down to the foot and ankle.  This is normal after surgery.  Elevate your leg when you are not up walking on it.   Continue to use the breathing machine you got in the hospital (incentive spirometer) which will help keep your temperature down.  It is common for your temperature to cycle up and down following surgery, especially at night when you are not up moving around and exerting yourself.  The breathing machine keeps your lungs expanded and your temperature down.   DIET:  As you were doing prior to hospitalization, we recommend a well-balanced diet.  DRESSING / WOUND CARE / SHOWERING  Keep the surgical dressing until follow up.  The dressing is water proof, so you can shower without any extra covering.  IF THE DRESSING FALLS OFF or the wound gets wet inside, change the dressing with sterile gauze.  Please use good hand washing techniques before changing the dressing.  Do not use any lotions or creams on the incision until instructed by your surgeon.    ACTIVITY  Increase activity slowly as tolerated, but follow the weight bearing instructions below.   No driving for 6 weeks or until further direction  given by your physician.  You cannot drive while taking narcotics.  No lifting or carrying greater than 10 lbs. until further directed by your surgeon. Avoid periods of inactivity such as sitting longer than an hour when not asleep. This helps prevent blood clots.  You may return to work once you are authorized by your doctor.     WEIGHT BEARING   Weight bearing as tolerated with assist device (walker, cane, etc) as directed, use it as long as suggested by your surgeon or therapist, typically at least 4-6 weeks.   EXERCISES  Results after joint replacement surgery are often greatly improved when you follow the exercise, range of motion and muscle strengthening exercises prescribed by your doctor. Safety measures are also important to protect the joint from further injury. Any time any of these exercises cause you to have increased pain or swelling, decrease what you are doing until you are comfortable again and then slowly increase them. If you have problems or questions, call your caregiver or physical therapist for advice.   Rehabilitation is important following a joint replacement. After just a few days of immobilization, the muscles of the leg can become weakened and shrink (atrophy).  These exercises are designed to build up the tone and strength of the thigh and leg muscles and to improve motion. Often times heat used for twenty to thirty minutes before working out will loosen up your tissues and help with improving the range of motion but do not use heat for the first two weeks following surgery (sometimes heat can increase post-operative swelling).   These exercises can be done on a training (exercise) mat, on the floor, on a table or on a bed. Use whatever works the best  and is most comfortable for you.    Use music or television while you are exercising so that the exercises are a pleasant break in your day. This will make your life better with the exercises acting as a break in your  routine that you can look forward to.   Perform all exercises about fifteen times, three times per day or as directed.  You should exercise both the operative leg and the other leg as well.   Exercises include:   Quad Sets - Tighten up the muscle on the front of the thigh (Quad) and hold for 5-10 seconds.   Straight Leg Raises - With your knee straight (if you were given a brace, keep it on), lift the leg to 60 degrees, hold for 3 seconds, and slowly lower the leg.  Perform this exercise against resistance later as your leg gets stronger.  Leg Slides: Lying on your back, slowly slide your foot toward your buttocks, bending your knee up off the floor (only go as far as is comfortable). Then slowly slide your foot back down until your leg is flat on the floor again.  Angel Wings: Lying on your back spread your legs to the side as far apart as you can without causing discomfort.  Hamstring Strength:  Lying on your back, push your heel against the floor with your leg straight by tightening up the muscles of your buttocks.  Repeat, but this time bend your knee to a comfortable angle, and push your heel against the floor.  You may put a pillow under the heel to make it more comfortable if necessary.   A rehabilitation program following joint replacement surgery can speed recovery and prevent re-injury in the future due to weakened muscles. Contact your doctor or a physical therapist for more information on knee rehabilitation.    CONSTIPATION  Constipation is defined medically as fewer than three stools per week and severe constipation as less than one stool per week.  Even if you have a regular bowel pattern at home, your normal regimen is likely to be disrupted due to multiple reasons following surgery.  Combination of anesthesia, postoperative narcotics, change in appetite and fluid intake all can affect your bowels.   YOU MUST use at least one of the following options; they are listed in order of  increasing strength to get the job done.  They are all available over the counter, and you may need to use some, POSSIBLY even all of these options:    Drink plenty of fluids (prune juice may be helpful) and high fiber foods Colace 100 mg by mouth twice a day  Senokot for constipation as directed and as needed Dulcolax (bisacodyl), take with full glass of water  Miralax (polyethylene glycol) once or twice a day as needed.  If you have tried all these things and are unable to have a bowel movement in the first 3-4 days after surgery call either your surgeon or your primary doctor.    If you experience loose stools or diarrhea, hold the medications until you stool forms back up.  If your symptoms do not get better within 1 week or if they get worse, check with your doctor.  If you experience "the worst abdominal pain ever" or develop nausea or vomiting, please contact the office immediately for further recommendations for treatment.   ITCHING:  If you experience itching with your medications, try taking only a single pain pill, or even half a pain pill at a  time.  You can also use Benadryl over the counter for itching or also to help with sleep.   TED HOSE STOCKINGS:  Use stockings on both legs until for at least 2 weeks or as directed by physician office. They may be removed at night for sleeping.  MEDICATIONS:  See your medication summary on the "After Visit Summary" that nursing will review with you.  You may have some home medications which will be placed on hold until you complete the course of blood thinner medication.  It is important for you to complete the blood thinner medication as prescribed.  PRECAUTIONS:  If you experience chest pain or shortness of breath - call 911 immediately for transfer to the hospital emergency department.   If you develop a fever greater that 101 F, purulent drainage from wound, increased redness or drainage from wound, foul odor from the wound/dressing, or  calf pain - CONTACT YOUR SURGEON.                                                   FOLLOW-UP APPOINTMENTS:  If you do not already have a post-op appointment, please call the office for an appointment to be seen by your surgeon.  Guidelines for how soon to be seen are listed in your "After Visit Summary", but are typically between 1-4 weeks after surgery.  OTHER INSTRUCTIONS:   Knee Replacement:  Do not place pillow under knee, focus on keeping the knee straight while resting. CPM instructions: 0-90 degrees, 2 hours in the morning, 2 hours in the afternoon, and 2 hours in the evening. Place foam block, curve side up under heel at all times except when in CPM or when walking.  DO NOT modify, tear, cut, or change the foam block in any way.  MAKE SURE YOU:  Understand these instructions.  Get help right away if you are not doing well or get worse.    Thank you for letting us be a part of your medical care team.  It is a privilege we respect greatly.  We hope these instructions will help you stay on track for a fast and full recovery!     Driving restrictions    Complete by:  As directed   No driving for 6 weeks     Follow the hip precautions as taught in Physical Therapy    Complete by:  As directed      Increase activity slowly as tolerated    Complete by:  As directed      Lifting restrictions    Complete by:  As directed   No lifting for 6 weeks     Patient may shower    Complete by:  As directed   [place].  DO NOT REMOVE DRESSING     TED hose    Complete by:  As directed   Use stockings (TED hose) for 2 weeks on RIGHT leg(s).  You may remove them at night for sleeping.     Weight bearing as tolerated    Complete by:  As directed   Laterality:  right  Extremity:  Lower           DISCHARGE MEDICATIONS:     Medication List    TAKE these medications        albuterol 108 (90 BASE)  MCG/ACT inhaler  Commonly known as:  PROAIR HFA  2 puffs every 4  hours as needed only  if your can't catch your breath     cetirizine 10 MG tablet  Commonly known as:  ZYRTEC  Take 10 mg by mouth daily.     methocarbamol 500 MG tablet  Commonly known as:  ROBAXIN  Take 1 tablet (500 mg total) by mouth every 8 (eight) hours as needed for muscle spasms.     mometasone-formoterol 100-5 MCG/ACT Aero  Commonly known as:  DULERA  Take 2 puffs first thing in am and then another 2 puffs about 12 hours later.     oxyCODONE 5 MG immediate release tablet  Commonly known as:  Oxy IR/ROXICODONE  Take 1-2 tablets (5-10 mg total) by mouth every 4 (four) hours as needed for breakthrough pain.     ranitidine 150 MG tablet  Commonly known as:  ZANTAC  One at bedtime     rivaroxaban 10 MG Tabs tablet  Commonly known as:  XARELTO  Take 1 tablet (10 mg total) by mouth daily with breakfast.     sertraline 100 MG tablet  Commonly known as:  ZOLOFT  Take 100 mg by mouth daily.     sodium chloride 0.65 % Soln nasal spray  Commonly known as:  OCEAN  Place 1 spray into both nostrils daily as needed for congestion.      ASK your doctor about these medications        predniSONE 10 MG tablet  Commonly known as:  DELTASONE  Take  4 each am x 2 days,   2 each am x 2 days,  1 each am x 2 days and stop        FOLLOW UP VISIT:   Follow-up Information    Follow up with [facility].   Why:  They will contact you to schedule home therapy visits.   Contact information:   [address] [place] [address] [phone]       Follow up with PARRETT,TAMMY, NP On 02/05/2015.   Specialty:  Nurse Practitioner   Why:  9 am   Contact information:   [address] [place] [address] [phone]       Follow up with Garald Balding, MD. Schedule an appointment as soon as possible for a visit on 01/19/2015.   Specialty:  Orthopedic Surgery   Contact information:   [address] [place] [address] [phone]         DISPOSITION:   Home  CONDITION:  Stable   Mike Craze. [place], [place] [phone]  01/07/2015 10:30 AM

## 2015-01-07 NOTE — Care Management Note (Signed)
Case Management Note  Patient Details  Name: Valerie Patterson MRN: [id number] Date of Birth: 03/26/1951  Subjective/Objective:        S/p right total hip arthroplasty            Action/Plan: Set up with [facility] for HHPT by MD office. Spoke with patient, no change in discharge plan. Patient states that she has a rolling walker, contacted James with [facility] and requested 3N1 be delivered to room. Patient states that her husband will be available to assist after discharge.    Expected Discharge Date:                  Expected Discharge Plan:  [place]  In-House Referral:  NA  Discharge planning Services  CM Consult  Post Acute Care Choice:  Durable Medical Equipment, Home Health Choice offered to:  Patient, Spouse  DME Arranged:  3-N-1 DME Agency:  [place]:  PT Phil [place]:  [place]  Status of Service:  Completed, signed off  Medicare Important Message Given:  N/A - LOS <3 / Initial given by admissions Date Medicare IM Given:    Medicare IM give by:    Date Additional Medicare IM Given:    Additional Medicare Important Message give by:     If discussed at [place] of Stay Meetings, dates discussed:    Additional Comments:  Nila Nephew, RN 01/07/2015, 10:38 AM

## 2015-02-05 ENCOUNTER — Inpatient Hospital Stay: Payer: Medicare Other | Admitting: Adult Health

## 2015-03-19 ENCOUNTER — Encounter: Payer: Self-pay | Admitting: Adult Health

## 2015-03-19 ENCOUNTER — Telehealth: Payer: Self-pay | Admitting: Adult Health

## 2015-03-19 ENCOUNTER — Ambulatory Visit (INDEPENDENT_AMBULATORY_CARE_PROVIDER_SITE_OTHER): Payer: Medicare Other | Admitting: Adult Health

## 2015-03-19 VITALS — BP 136/72 | HR 66 | Temp 97.9°F | Ht 62.0 in | Wt 150.0 lb

## 2015-03-19 DIAGNOSIS — J4530 Mild persistent asthma, uncomplicated: Secondary | ICD-10-CM

## 2015-03-19 MED ORDER — AZITHROMYCIN 250 MG PO TABS
ORAL_TABLET | ORAL | Status: AC
Start: 2015-03-19 — End: 2015-03-24

## 2015-03-19 NOTE — Telephone Encounter (Signed)
Per TP: Pt personally stated she has taken the Zpak before and is able to tolerate Azithromycin. Pt can proceed to take the Zpak.   Called the pharmacy and spoke to [place] and informed her of the recs per TP and that the pt stated she has taken Zpak before and tolerates its fine. Helene Kelp verbalized understanding and questioned what reaction we had on file for the pt under the microlide and ketolide class, informed her per the pt's chart, the pt took erythromycin and experienced N/V. Helene Kelp verbalized understanding and denied any further questions or concerns at this time.

## 2015-03-19 NOTE — Assessment & Plan Note (Signed)
Mild flare with URI /sinusitis  Hold on steroids at this time   Plan  Zpack take as directed.  Mucinex DM Twice daily  As needed  Cough/congestion  Saline nasal rinses As needed   Continue on Dulera 2 puffs Twice daily  , rinse after use.  Continue on Zyrtec 10mg  At bedtime   Please contact office for sooner follow up if symptoms do not improve or worsen or seek emergency care  follow up Dr. Melvyn Novas  In 2 months and As needed

## 2015-03-19 NOTE — Telephone Encounter (Signed)
De Burrs at pharmacy She is allergic to [facility] and [place] wants to make sure that it is okay to fill the Zpak.  Tammy - please advise.

## 2015-03-19 NOTE — Patient Instructions (Addendum)
Zpack take as directed.  Mucinex DM Twice daily  As needed  Cough/congestion  Saline nasal rinses As needed   Continue on Dulera 2 puffs Twice daily  , rinse after use.  Continue on Zyrtec 10mg  At bedtime   Please contact office for sooner follow up if symptoms do not improve or worsen or seek emergency care  follow up Dr. Melvyn Novas  In 2 months and As needed

## 2015-03-19 NOTE — Progress Notes (Signed)
   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    12/18/2014 1st [facility] office visit/ [place]   Chief Complaint  Patient presents with  . Pulmonary Consult    Self referral. Pt states dxed with Asthma in 2008. She c/o increased SOB for the past month- better with taking albuterol nebs 3 x daily.  She also c/o cough with clear sputum and PND.   worse since last set of pfts 11/17/14 which showed minimal airflow obstruction  Main symptom now is constant urge to clear throat/ worse when eat/ also when lies down but once goes to sleep has no problem and activity is limited by hip pain, no doe. >dulera 200 to 100 and pred taper   03/19/2015 Follow up : Asthma  Patient returns for a follow-up. Last visit. Patient was changed over from [facility] 200 to the -100 dose. She was given a prednisone taper. Patient says she has been doing well up until this last week. Feels that she is getting sinus congestion and pressure. She has some pain over the maxillary sinus. She is bloated out some yellow mucus. Patient recently had hip surgery and has been doing well. Patient denies any hemoptysis, chest pain, orthopnea, PND, or increased leg swelling.      Current Medications, Allergies, Complete Past Medical History, Past Surgical History, Family History, and Social History were reviewed in [employer] record.          Review of Systems  Constitutional: Negative for fever, chills and unexpected weight change.  HENT: Negative for dental problem, ear pain, nosebleeds, rhinorrhea, sinus pressure, sneezing, sore throat, trouble swallowing and voice change.   Eyes:  Negative for visual disturbance.  Respiratory: Positive for cough  Negative for choking.   Cardiovascular: Negative for chest pain and leg swelling.  Gastrointestinal: Negative for vomiting, abdominal pain and diarrhea.  Genitourinary: Negative for difficulty urinating.   Musculoskeletal: Negative for arthralgias.  Skin: Negative for rash.  Neurological: Negative for tremors, syncope and headaches.  Hematological: Does not bruise/bleed easily.       Objective:   Physical Exam   Vital signs reviewed   W/c bound hoarse wf with freq throat clearing/   HEENT: nl dentition, turbinates, and orophanx. Nl external ear canals without cough reflex. Clear nasal drainage, and left maxillary sinus tenderness   NECK :  without JVD/Nodes/TM/ nl carotid upstrokes bilaterally   LUNGS: no acc muscle use, clear to A and P bilaterally without cough on insp or exp maneuvers   CV:  RRR  no s3 or murmur or increase in P2, no edema   ABD:  soft and nontender with nl excursion in the supine position. No bruits or organomegaly, bowel sounds nl  MS:  warm without deformities, calf tenderness, cyanosis or clubbing  SKIN: warm and dry without lesions    NEURO:  alert, approp, no deficits    CXR:  12/02/2014 Mild density noted projected over the lingula, most likely prominent epicardial fat pad. Exam otherwise unremarkable.

## 2015-03-20 NOTE — Progress Notes (Signed)
Chart and office note reviewed in detail  > agree with a/p as outlined    

## 2015-04-06 ENCOUNTER — Telehealth: Payer: Self-pay | Admitting: Adult Health

## 2015-04-06 DIAGNOSIS — R058 Other specified cough: Secondary | ICD-10-CM

## 2015-04-06 DIAGNOSIS — R05 Cough: Secondary | ICD-10-CM

## 2015-04-06 NOTE — Telephone Encounter (Signed)
[phone] calling back

## 2015-04-06 NOTE — Telephone Encounter (Signed)
Called and spoke with pt Pt states having OV with Tammy Parrett on 03/19/15 and was given z-pak Pt has completed course for Z-Pak and states that she is still not feeling better Pt c/o severe nasal congestion with clear mucus, sinus drainage and pressure Denies fever, SOB, chest congestion or wheezing a this time   Dr Melvyn Novas, please advise. Thanks!

## 2015-04-06 NOTE — Telephone Encounter (Signed)
Sinus ct asap so we can sort out what she needes   Dx : upper airway cough syndrome

## 2015-04-06 NOTE — Telephone Encounter (Signed)
Called and spoke to pt. Informed her of the recs per MW. Order placed for stat CT sinus. Pt verbalized understanding and denied any further questions or concerns at this time.

## 2015-04-06 NOTE — Telephone Encounter (Signed)
lmtcb for Valerie Patterson.

## 2015-04-07 ENCOUNTER — Ambulatory Visit (HOSPITAL_COMMUNITY)
Admission: RE | Admit: 2015-04-07 | Discharge: 2015-04-07 | Disposition: A | Discharge status: Home / Self Care | Payer: Medicare Other | Source: Ambulatory Visit | Attending: Internal Medicine | Admitting: Internal Medicine

## 2015-04-07 ENCOUNTER — Encounter (HOSPITAL_COMMUNITY): Payer: Self-pay

## 2015-04-07 DIAGNOSIS — R058 Other specified cough: Secondary | ICD-10-CM

## 2015-04-07 DIAGNOSIS — R05 Cough: Secondary | ICD-10-CM | POA: Insufficient documentation

## 2015-04-07 DIAGNOSIS — J329 Chronic sinusitis, unspecified: Secondary | ICD-10-CM | POA: Insufficient documentation

## 2015-04-07 DIAGNOSIS — R0981 Nasal congestion: Secondary | ICD-10-CM | POA: Diagnosis present

## 2015-04-08 ENCOUNTER — Telehealth: Payer: Self-pay | Admitting: Internal Medicine

## 2015-04-08 DIAGNOSIS — J329 Chronic sinusitis, unspecified: Secondary | ICD-10-CM

## 2015-04-08 NOTE — Telephone Encounter (Signed)
Pt calling agaiin a/b results.Valerie Patterson

## 2015-04-08 NOTE — Telephone Encounter (Signed)
Notes Recorded by Tanda Rockers, MD on 04/07/2015 at 5:30 PM Call patient : Study is C/w Severe diffuse sinusitis refer to ent / dr rosen's group if no one established already  ----------------------------------------------  Spoke with pt, aware of results/recs.  Referral placed.  Nothing further needed.

## 2015-06-03 ENCOUNTER — Other Ambulatory Visit: Payer: Self-pay | Admitting: Otolaryngology

## 2015-06-04 ENCOUNTER — Encounter (HOSPITAL_COMMUNITY)
Admission: RE | Admit: 2015-06-04 | Discharge: 2015-06-04 | Disposition: A | Discharge status: Home / Self Care | Payer: Medicare Other | Source: Ambulatory Visit | Attending: Otolaryngology | Admitting: Otolaryngology

## 2015-06-04 ENCOUNTER — Encounter (HOSPITAL_COMMUNITY): Payer: Self-pay

## 2015-06-04 DIAGNOSIS — J329 Chronic sinusitis, unspecified: Secondary | ICD-10-CM | POA: Insufficient documentation

## 2015-06-04 DIAGNOSIS — Z01812 Encounter for preprocedural laboratory examination: Secondary | ICD-10-CM | POA: Insufficient documentation

## 2015-06-04 DIAGNOSIS — J342 Deviated nasal septum: Secondary | ICD-10-CM | POA: Insufficient documentation

## 2015-06-04 HISTORY — DX: Unspecified urinary incontinence: R32

## 2015-06-04 LAB — BASIC METABOLIC PANEL
ANION GAP: 6 (ref 5–15)
BUN: 11 mg/dL (ref 6–20)
CALCIUM: 10 mg/dL (ref 8.9–10.3)
CHLORIDE: 105 mmol/L (ref 101–111)
CO2: 28 mmol/L (ref 22–32)
CREATININE: 0.58 mg/dL (ref 0.44–1.00)
GFR calc non Af Amer: >60 mL/min (ref >60)
Glucose, Bld: 88 mg/dL (ref 65–99)
Potassium: 3.9 mmol/L (ref 3.5–5.1)
SODIUM: 139 mmol/L (ref 135–145)

## 2015-06-04 LAB — CBC
HCT: 39.9 % (ref 36.0–46.0)
HEMOGLOBIN: 12.8 g/dL (ref 12.0–15.0)
MCH: 26.6 pg (ref 26.0–34.0)
MCHC: 32.1 g/dL (ref 30.0–36.0)
MCV: 83 fL (ref 78.0–100.0)
PLATELETS: 279 10*3/uL (ref 150–400)
RBC: 4.81 MIL/uL (ref 3.87–5.11)
RDW: 15.5 % (ref 11.5–15.5)
WBC: 7.9 10*3/uL (ref 4.0–10.5)

## 2015-06-04 NOTE — Pre-Procedure Instructions (Signed)
Valerie Patterson  06/04/2015      [facility] [place] [place] [facility] [place] [address] [place] [address] Phone: [phone] Fax: [phone]    Your procedure is scheduled on 06/09/2015.  Report to [facility] Admitting at 8:00 A.M.  Call this number if you have problems the morning of surgery:  [phone]   Remember:  Do not eat food or drink liquids after midnight.  On Tuesday night   Take these medicines the morning of surgery with A SIP OF WATER : use rescue inhaler if needed, use Dulera inhaler , ocean nasal spray, Sertraline, Zyrtec, prednisone dosage pack, Zantac   Do not wear jewelry, make-up or nail polish.   Do not wear lotions, powders, or perfumes.  You may wear deodorant.   Do not shave 48 hours prior to surgery.     Do not bring valuables to the hospital.   [facility] is not responsible for any belongings or valuables.  Contacts, dentures or bridgework may not be worn into surgery.  Leave your suitcase in the car.  After surgery it may be brought to your room.  For patients admitted to the hospital, discharge time will be determined by your treatment team.  Patients discharged the day of surgery will not be allowed to drive home.   Name and phone number of your driver:   Spouse  Special instructions:  Special Instructions: [name]  Before surgery, you can play an important role.  Because skin is not sterile, your skin needs to be as free of germs as possible.  You can reduce the number of germs on you skin by washing with CHG (chlorahexidine gluconate) soap before surgery.  CHG is an antiseptic cleaner which kills germs and bonds with the skin to continue killing germs even after washing.  Please DO NOT use if you have an allergy to CHG or antibacterial soaps.  If your skin becomes reddened/irritated stop using the CHG and inform your nurse when you arrive at [facility]  Stay.  Do not shave (including legs and underarms) for at least 48 hours prior to the first CHG shower.  You may shave your face.  Please follow these instructions carefully:   1.  Shower with CHG Soap the night before surgery and the  morning of Surgery.  2.  If you choose to wash your hair, wash your hair first as usual with your  normal shampoo.  3.  After you shampoo, rinse your hair and body thoroughly to remove the  Shampoo.  4.  Use CHG as you would any other liquid soap.  You can apply chg directly to the skin and wash gently with scrungie or a clean washcloth.  5.  Apply the CHG Soap to your body ONLY FROM THE NECK DOWN.    Do not use on open wounds or open sores.  Avoid contact with your eyes, ears, mouth and genitals (private parts).  Wash genitals (private parts)   with your normal soap.  6.  Wash thoroughly, paying special attention to the area where your surgery will be performed.  7.  Thoroughly rinse your body with warm water from the neck down.  8.  DO NOT shower/wash with your normal soap after using and rinsing off   the CHG Soap.  9.  Pat yourself dry with a clean towel.            10.  Wear clean pajamas.            11.  Place clean sheets on your bed the night of your first shower and do not sleep with pets.  Day of Surgery  Do not apply any lotions/deodorants the morning of surgery.  Please wear clean clothes to the hospital/surgery center.  Please read over the following fact sheets that you were given. Pain Booklet, Coughing and Deep Breathing and Surgical Site Infection Prevention

## 2015-06-04 NOTE — Progress Notes (Signed)
Pt. Will take last dose of Pred-Pak on dos. Denies any increased /change in her breathing. States 2 days ago, she raked leaves for 8 hrs.(wearing a mask) & she didn't have any reaction or distress.

## 2015-06-08 MED ORDER — DEXAMETHASONE SODIUM PHOSPHATE 10 MG/ML IJ SOLN
10.0000 mg | Freq: Once | INTRAMUSCULAR | Status: AC
  Administered 2015-06-09: 10 mg via INTRAVENOUS
  Filled 2015-06-08: qty 1

## 2015-06-08 MED ORDER — CEFAZOLIN SODIUM-DEXTROSE 2-3 GM-% IV SOLR
2.0000 g | INTRAVENOUS | Status: AC
  Administered 2015-06-09: 2 g via INTRAVENOUS
  Filled 2015-06-08: qty 50

## 2015-06-09 ENCOUNTER — Encounter (HOSPITAL_COMMUNITY): Payer: Self-pay | Admitting: Otolaryngology

## 2015-06-09 ENCOUNTER — Ambulatory Visit (HOSPITAL_COMMUNITY): Payer: Medicare Other | Admitting: Certified Registered Nurse Anesthetist

## 2015-06-09 ENCOUNTER — Encounter (HOSPITAL_COMMUNITY)
Admission: RE | Disposition: A | Discharge status: Home / Self Care | Payer: Self-pay | Source: Ambulatory Visit | Attending: Otolaryngology

## 2015-06-09 ENCOUNTER — Ambulatory Visit (HOSPITAL_COMMUNITY)
Admission: RE | Admit: 2015-06-09 | Discharge: 2015-06-09 | Disposition: A | Discharge status: Home / Self Care | Payer: Medicare Other | Source: Ambulatory Visit | Attending: Otolaryngology | Admitting: Otolaryngology

## 2015-06-09 DIAGNOSIS — J3489 Other specified disorders of nose and nasal sinuses: Secondary | ICD-10-CM | POA: Diagnosis not present

## 2015-06-09 DIAGNOSIS — J343 Hypertrophy of nasal turbinates: Secondary | ICD-10-CM | POA: Insufficient documentation

## 2015-06-09 DIAGNOSIS — M160 Bilateral primary osteoarthritis of hip: Secondary | ICD-10-CM | POA: Insufficient documentation

## 2015-06-09 DIAGNOSIS — Z888 Allergy status to other drugs, medicaments and biological substances status: Secondary | ICD-10-CM | POA: Insufficient documentation

## 2015-06-09 DIAGNOSIS — Z881 Allergy status to other antibiotic agents status: Secondary | ICD-10-CM | POA: Insufficient documentation

## 2015-06-09 DIAGNOSIS — Z7901 Long term (current) use of anticoagulants: Secondary | ICD-10-CM | POA: Insufficient documentation

## 2015-06-09 DIAGNOSIS — Z885 Allergy status to narcotic agent status: Secondary | ICD-10-CM | POA: Diagnosis not present

## 2015-06-09 DIAGNOSIS — J45909 Unspecified asthma, uncomplicated: Secondary | ICD-10-CM | POA: Diagnosis not present

## 2015-06-09 DIAGNOSIS — F329 Major depressive disorder, single episode, unspecified: Secondary | ICD-10-CM | POA: Insufficient documentation

## 2015-06-09 DIAGNOSIS — J342 Deviated nasal septum: Secondary | ICD-10-CM | POA: Insufficient documentation

## 2015-06-09 DIAGNOSIS — K219 Gastro-esophageal reflux disease without esophagitis: Secondary | ICD-10-CM | POA: Insufficient documentation

## 2015-06-09 DIAGNOSIS — E7800 Pure hypercholesterolemia, unspecified: Secondary | ICD-10-CM | POA: Diagnosis not present

## 2015-06-09 DIAGNOSIS — L309 Dermatitis, unspecified: Secondary | ICD-10-CM | POA: Insufficient documentation

## 2015-06-09 DIAGNOSIS — Z882 Allergy status to sulfonamides status: Secondary | ICD-10-CM | POA: Insufficient documentation

## 2015-06-09 DIAGNOSIS — D164 Benign neoplasm of bones of skull and face: Secondary | ICD-10-CM | POA: Diagnosis not present

## 2015-06-09 DIAGNOSIS — J329 Chronic sinusitis, unspecified: Secondary | ICD-10-CM | POA: Insufficient documentation

## 2015-06-09 HISTORY — PX: SINUS ENDO W/FUSION: SHX777

## 2015-06-09 HISTORY — PX: NASAL SEPTOPLASTY W/ TURBINOPLASTY: SHX2070

## 2015-06-09 SURGERY — SINUS SURGERY, ENDOSCOPIC, USING COMPUTER-ASSISTED NAVIGATION
Anesthesia: General | Laterality: Bilateral

## 2015-06-09 MED ORDER — LIDOCAINE-EPINEPHRINE 1 %-1:100000 IJ SOLN
INTRAMUSCULAR | Status: AC
  Filled 2015-06-09: qty 1

## 2015-06-09 MED ORDER — LIDOCAINE-EPINEPHRINE 1 %-1:100000 IJ SOLN
INTRAMUSCULAR | Status: DC | PRN
  Administered 2015-06-09: 9 mL

## 2015-06-09 MED ORDER — LACTATED RINGERS IV SOLN
INTRAVENOUS | Status: DC
  Administered 2015-06-09 (×2): via INTRAVENOUS

## 2015-06-09 MED ORDER — OXYMETAZOLINE HCL 0.05 % NA SOLN
NASAL | Status: AC
  Filled 2015-06-09: qty 15

## 2015-06-09 MED ORDER — EPHEDRINE SULFATE 50 MG/ML IJ SOLN
INTRAMUSCULAR | Status: DC | PRN
  Administered 2015-06-09 (×2): 5 mg via INTRAVENOUS

## 2015-06-09 MED ORDER — TRIAMCINOLONE ACETONIDE 40 MG/ML IJ SUSP
INTRAMUSCULAR | Status: AC
  Filled 2015-06-09: qty 5

## 2015-06-09 MED ORDER — MOXIFLOXACIN HCL 400 MG PO TABS: 400.0000 mg | ORAL_TABLET | Freq: Every day | ORAL | Status: DC

## 2015-06-09 MED ORDER — OXYMETAZOLINE HCL 0.05 % NA SOLN
NASAL | Status: DC | PRN
  Administered 2015-06-09: 2 via NASAL

## 2015-06-09 MED ORDER — ONDANSETRON HCL 4 MG/2ML IJ SOLN
INTRAMUSCULAR | Status: DC | PRN
  Administered 2015-06-09: 4 mg via INTRAVENOUS

## 2015-06-09 MED ORDER — 0.9 % SODIUM CHLORIDE (POUR BTL) OPTIME
TOPICAL | Status: DC | PRN
  Administered 2015-06-09: 1000 mL

## 2015-06-09 MED ORDER — NEOSTIGMINE METHYLSULFATE 10 MG/10ML IV SOLN
INTRAVENOUS | Status: DC | PRN
  Administered 2015-06-09: 3 mg via INTRAVENOUS

## 2015-06-09 MED ORDER — MUPIROCIN CALCIUM 2 % EX CREA
TOPICAL_CREAM | CUTANEOUS | Status: AC
  Filled 2015-06-09: qty 15

## 2015-06-09 MED ORDER — PROPOFOL 10 MG/ML IV BOLUS
INTRAVENOUS | Status: AC
  Filled 2015-06-09: qty 40

## 2015-06-09 MED ORDER — ROCURONIUM BROMIDE 100 MG/10ML IV SOLN
INTRAVENOUS | Status: DC | PRN
  Administered 2015-06-09: 40 mg via INTRAVENOUS

## 2015-06-09 MED ORDER — PROPOFOL 10 MG/ML IV BOLUS
INTRAVENOUS | Status: DC | PRN
Start: 2015-06-09 — End: 2015-06-09
  Administered 2015-06-09: 160 mg via INTRAVENOUS
  Administered 2015-06-09: 40 mg via INTRAVENOUS

## 2015-06-09 MED ORDER — ONDANSETRON HCL 4 MG/2ML IJ SOLN: 4.0000 mg | Freq: Once | INTRAMUSCULAR | Status: DC | PRN

## 2015-06-09 MED ORDER — GLYCOPYRROLATE 0.2 MG/ML IJ SOLN
INTRAMUSCULAR | Status: DC | PRN
  Administered 2015-06-09: 0.4 mg via INTRAVENOUS

## 2015-06-09 MED ORDER — FENTANYL CITRATE (PF) 100 MCG/2ML IJ SOLN
INTRAMUSCULAR | Status: DC | PRN
  Administered 2015-06-09 (×2): 25 ug via INTRAVENOUS
  Administered 2015-06-09: 50 ug via INTRAVENOUS
  Administered 2015-06-09: 25 ug via INTRAVENOUS
  Administered 2015-06-09: 50 ug via INTRAVENOUS

## 2015-06-09 MED ORDER — MIDAZOLAM HCL 2 MG/2ML IJ SOLN
INTRAMUSCULAR | Status: AC
  Filled 2015-06-09: qty 2

## 2015-06-09 MED ORDER — HYDROCODONE-ACETAMINOPHEN 5-325 MG PO TABS: 1.0000 | ORAL_TABLET | Freq: Four times a day (QID) | ORAL | Status: DC | PRN

## 2015-06-09 MED ORDER — PHENYLEPHRINE HCL 10 MG/ML IJ SOLN
INTRAMUSCULAR | Status: DC | PRN
  Administered 2015-06-09: 40 ug via INTRAVENOUS

## 2015-06-09 MED ORDER — ARTIFICIAL TEARS OP OINT
TOPICAL_OINTMENT | OPHTHALMIC | Status: DC | PRN
  Administered 2015-06-09: 1 via OPHTHALMIC

## 2015-06-09 MED ORDER — FENTANYL CITRATE (PF) 100 MCG/2ML IJ SOLN: 25.0000 ug | INTRAMUSCULAR | Status: DC | PRN

## 2015-06-09 MED ORDER — FENTANYL CITRATE (PF) 250 MCG/5ML IJ SOLN
INTRAMUSCULAR | Status: AC
  Filled 2015-06-09: qty 5

## 2015-06-09 MED ORDER — MUPIROCIN CALCIUM 2 % EX CREA
TOPICAL_CREAM | CUTANEOUS | Status: DC | PRN
  Administered 2015-06-09: 1 via TOPICAL

## 2015-06-09 MED ORDER — ONDANSETRON 4 MG PO TBDP: 4.0000 mg | ORAL_TABLET | Freq: Three times a day (TID) | ORAL | Status: DC | PRN

## 2015-06-09 MED ORDER — SODIUM CHLORIDE 0.9 % IR SOLN
Status: DC | PRN
  Administered 2015-06-09: 1000 mL

## 2015-06-09 MED ORDER — LIDOCAINE HCL (CARDIAC) 20 MG/ML IV SOLN
INTRAVENOUS | Status: DC | PRN
  Administered 2015-06-09: 50 mg via INTRAVENOUS

## 2015-06-09 MED ORDER — OXYMETAZOLINE HCL 0.05 % NA SOLN
NASAL | Status: DC | PRN
  Administered 2015-06-09: 1 via TOPICAL

## 2015-06-09 MED ORDER — MEPERIDINE HCL 25 MG/ML IJ SOLN: 6.2500 mg | INTRAMUSCULAR | Status: DC | PRN

## 2015-06-09 SURGICAL SUPPLY — 45 items
ATTRACTOMAT 16X20 MAGNETIC DRP (DRAPES) IMPLANT
BLADE ROTATE RAD 40 4 M4 (BLADE) ×2 IMPLANT
BLADE ROTATE TRICUT 4X13 M4 (BLADE) ×2 IMPLANT
BLADE SURG 15 STRL LF DISP TIS (BLADE) ×1 IMPLANT
BLADE SURG 15 STRL SS (BLADE) ×1
CANISTER SUCTION 2500CC (MISCELLANEOUS) ×2 IMPLANT
COAGULATOR SUCT 8FR VV (MISCELLANEOUS) ×2 IMPLANT
COAGULATOR SUCT SWTCH 10FR 6 (ELECTROSURGICAL) IMPLANT
DRAPE PROXIMA HALF (DRAPES) IMPLANT
DRESSING NASAL KENNEDY 3.5X.9 (MISCELLANEOUS) ×1 IMPLANT
DRSG NASAL KENNEDY 3.5X.9 (MISCELLANEOUS) ×2
ELECT COATED BLADE 2.86 ST (ELECTRODE) ×2 IMPLANT
ELECT REM PT RETURN 9FT ADLT (ELECTROSURGICAL) ×2
ELECTRODE REM PT RTRN 9FT ADLT (ELECTROSURGICAL) ×1 IMPLANT
FILTER ARTHROSCOPY CONVERTOR (FILTER) ×2 IMPLANT
FLUID NSS /IRRIG 1000 ML XXX (MISCELLANEOUS) ×2 IMPLANT
GAUZE SPONGE 2X2 8PLY STRL LF (GAUZE/BANDAGES/DRESSINGS) ×1 IMPLANT
GLOVE BIOGEL M 7.0 STRL (GLOVE) ×4 IMPLANT
GOWN STRL REUS W/ TWL LRG LVL3 (GOWN DISPOSABLE) ×2 IMPLANT
GOWN STRL REUS W/TWL LRG LVL3 (GOWN DISPOSABLE) ×2
KIT BASIN OR (CUSTOM PROCEDURE TRAY) ×2 IMPLANT
KIT ROOM TURNOVER OR (KITS) ×2 IMPLANT
NEEDLE 18GX1X1/2 (RX/OR ONLY) (NEEDLE) IMPLANT
NEEDLE HYPO 25GX1X1/2 BEV (NEEDLE) ×2 IMPLANT
NS IRRIG 1000ML POUR BTL (IV SOLUTION) ×2 IMPLANT
PAD ARMBOARD 7.5X6 YLW CONV (MISCELLANEOUS) ×4 IMPLANT
PENCIL BUTTON HOLSTER BLD 10FT (ELECTRODE) IMPLANT
SPECIMEN JAR SMALL (MISCELLANEOUS) IMPLANT
SPLINT NASAL DOYLE BI-VL (GAUZE/BANDAGES/DRESSINGS) ×2 IMPLANT
SPONGE GAUZE 2X2 STER 10/PKG (GAUZE/BANDAGES/DRESSINGS) ×1
SPONGE NEURO XRAY DETECT 1X3 (DISPOSABLE) ×2 IMPLANT
SUT ETHILON 3 0 FSL (SUTURE) IMPLANT
SUT ETHILON 3 0 PS 1 (SUTURE) ×2 IMPLANT
SUT PLAIN 4 0 ~~LOC~~ 1 (SUTURE) ×2 IMPLANT
SYR CONTROL 10ML LL (SYRINGE) IMPLANT
TOWEL OR 17X24 6PK STRL BLUE (TOWEL DISPOSABLE) ×2 IMPLANT
TRACKER ENT INSTRUMENT (MISCELLANEOUS) ×2 IMPLANT
TRACKER ENT PATIENT (MISCELLANEOUS) ×2 IMPLANT
TRAY ENT MC OR (CUSTOM PROCEDURE TRAY) ×2 IMPLANT
TUBE CONNECTING 12X1/4 (SUCTIONS) ×2 IMPLANT
TUBE SALEM SUMP 16 FR W/ARV (TUBING) ×2 IMPLANT
TUBING EXTENTION W/L.L. (IV SETS) ×2 IMPLANT
TUBING STRAIGHTSHOT EPS 5PK (TUBING) ×2 IMPLANT
WIPE INSTRUMENT VISIWIPE 73X73 (MISCELLANEOUS) ×2 IMPLANT
YANKAUER SUCT BULB TIP NO VENT (SUCTIONS) ×2 IMPLANT

## 2015-06-09 NOTE — Anesthesia Procedure Notes (Signed)
Procedure Name: Intubation Date/Time: 06/09/2015 10:21 AM Performed by: Tressia Miners LEFFEW Pre-anesthesia Checklist: Patient identified, Patient being monitored, Timeout performed, Emergency Drugs available and Suction available Patient Re-evaluated:Patient Re-evaluated prior to inductionOxygen Delivery Method: Circle System Utilized Preoxygenation: Pre-oxygenation with 100% oxygen Intubation Type: IV induction Ventilation: Mask ventilation without difficulty Laryngoscope Size: 3 Grade View: Grade I Tube type: Oral Tube size: 7.0 mm Number of attempts: 1 Airway Equipment and Method: Stylet and Video-laryngoscopy Placement Confirmation: ETT inserted through vocal cords under direct vision,  positive ETCO2 and breath sounds checked- equal and bilateral Secured at: 21 cm Tube secured with: Tape Dental Injury: Teeth and Oropharynx as per pre-operative assessment  Comments: Elective glidescope.

## 2015-06-09 NOTE — Op Note (Signed)
Valerie Patterson, Valerie Patterson                ACCOUNT NO.:  [id number]  MEDICAL RECORD NO.:  [id number]  LOCATION:  [facility]                         FACILITY:  [facility]  PHYSICIAN:  Early Chars. Wilburn Cornelia, M.D.DATE OF BIRTH:  September 07, 1950  DATE OF PROCEDURE:  06/09/2015 DATE OF DISCHARGE:                              OPERATIVE REPORT   LOCATION:  [facility] Main OR.  PREOPERATIVE DIAGNOSES: 1. Chronic sinusitis. 2. Deviated nasal septum with airway obstruction. 3. Bilateral inferior turbinate hypertrophy.  POSTOPERATIVE DIAGNOSES: 1. Chronic sinusitis. 2. Deviated nasal septum with airway obstruction. 3. Bilateral inferior turbinate hypertrophy.  INDICATION FOR SURGERY: 1. Chronic sinusitis. 2. Deviated nasal septum with airway obstruction. 3. Bilateral inferior turbinate hypertrophy.  SURGICAL PROCEDURE: 1. Bilateral endoscopic sinus surgery with intraoperative computer-     assisted navigation (fusion) consisting of bilateral total     ethmoidectomy and bilateral maxillary antrostomy with removal of     the diseased tissue and resection of left ethmoid osteoma. 2. Nasal septoplasty. 3. Bilateral inferior turbinate reduction.  ANESTHESIA:  General endotracheal.  SURGEON:  Early Chars. Wilburn Cornelia, MD.  COMPLICATIONS:  None.  ESTIMATED BLOOD LOSS:  Less than 150 mL.  DISPOSITION:  The patient was transferred from the operating room to the recovery room in stable condition.  FINDINGS:  Deviated nasal septum with airway obstruction.  Bilateral polypoid mucosal disease with chronic appearing changes including infection in the left maxillary sinus and bilateral ethmoid sinuses. 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  Bilateral Kennedy sinus packs were placed in the common ethmoid cavities.  The bilateral Doyle nasal septal splints were placed at the  conclusion of the surgical procedure.  BRIEF HISTORY:  The patient is a 64 year old white female who was referred by her pulmonary specialist, Dr. Melvyn Novas for evaluation and management of chronic sinusitis.  The patient has had chronic symptoms of progressive nasal airway obstruction, nasal congestion, and significant postnasal discharge with cough.  She has been treated for chronic pulmonary dysfunction and has had significant difficulty in controlling her asthmatic symptoms.  CT scan performed in Dr. Gustavus Bryant office showed significant soft tissue mucosal disease involving the ethmoid and maxillary sinuses consistent with chronic sinusitis. The patient was referred to our office for additional evaluation and treatment which included  a prolonged course of oral antibiotics, topical nasal and oral steroids, and saline nasal irrigation. Despite limited clinical improvement, a followup CT scan was performed which showed a severely deviated nasal septum, bilateral inferior turbinate hypertrophy, and chronic appearing sinus disease involving the ethmoid and maxillary sinuses bilaterally with polypoid mucosa and air-fluid levels.  The patient was also noted to have a small bony overgrowth in the left anterior superior ethmoid sinus consistent with a small osteoma. Given the patient's history and physical findings with failure to resolve her infection and CT findings as outlined above, I recommended the above surgical procedures.  The risks and benefits of procedure were discussed in detail with the patient and her husband. They understood and agreed with our plan for surgery which is scheduled on elective basis at [facility]  [place].  DESCRIPTION OF PROCEDURE:  The patient was brought to the operating room, placed in supine position on June 09, 2015.  General endotracheal anesthesia was established without difficulty.  When the patient was adequately anesthetized, she was positioned  and prepped and draped.  A surgical time-out was then performed with correct identification of the patient and the surgical procedure.  The patient's nose was then injected with a total of 9 mL of 1% lidocaine 1:100,000 solution of epinephrine which was injected in a submucosal fashion along the lateral nasal walls, middle turbinate ,uncinate process, nasal septum, and inferior turbinates bilaterally.  The patient's nose then packed with Afrin-soaked cottonoid pledgets and were left in place for approximately 10 minutes for vasoconstriction and hemostasis.  The Xomed Fusion headgear was then applied and anatomic and surgical landmarks were identified and confirmed. The Fusion navigation device was used throughout the sinus component of the surgical procedure.  With the patient prepped, draped, and prepared for surgery, the nasal cavity was examined.  She had a severely deviated nasal septum with near complete obstruction of the right nasal passageway.  Endoscopic sinus surgery was begun on the left-hand side using a 0-degree endoscope.  The middle turbinate was then carefully medialized, uncinate process reflected anteriorly and resected in its entirety with a through-cutting forceps.  Dissection was then carried from anterior to posterior using the straight microdebrider to remove bony septations and polypoid disease. The entire inferior ethmoid cavity was cleared and the posterior superior aspect of the ethmoid sinus was identified.  A 45 degree telescope, curved microdebrider and navigation was then used from posterior to anterior to remove bony septations and polypoid mucosa. In the anterior aspect of the left ethmoid sinus, there was a small 7 mm bony mass consistent with a small osteoma which had been identified on the preoperative CT scan.  Using a curved curette and microdebrider, the osteoma was carefully resected preserving the lamina papyracea and skull base.  No evidence of  spinal fluid leak or orbital injury.  The ostium was cleared completely and the sinuses were inspected.  Attention then turned to lateral nasal wall where the natural ostium of maxillary sinus was completely occluded with polypoid disease.  This was enlarged in an inferior and posterior direction.  Within the sinus, there was inspissated thick mucus secretions which were fully suctioned.  The sinus was inspected, and there was no evidence of further infection or polypoid disease.  The nasal septoplasty was then performed by creating a left anterior hemitransfixion incision.  This was carried through the mucosa and underlying submucosa and dissection was carried from anterior to posterior on the left-hand side.  The anterior cartilaginous septum was crossed at the midline and mucoperichondrial flap was elevated on the right.  Anterior and mid septal cartilage was then resected.  This was later morselized and returned to the mucoperichondrial pocket. Dissection then carried from anterior to posterior.  The large bony septal spur was resected along the right-hand side bringing the septum to a good midline position.  The morselized cartilage was returned to the mucoperichondrial pocket and the flaps were reapproximated with a 4- 0 gut suture on a Keith needle in a horizontal mattress fashion.  At the conclusion of the surgical procedure, bilateral Doyle nasal septal splints were placed after the application of Bactroban ointment and sutured in position with a 3-0 Ethilon suture.  Right endoscopic sinus surgery was then undertaken.  The middle turbinate was carefully medialized and the  uncinate process was resected.  Visualization with the 0-degree telescope was then used to carry the dissection from anterior to posterior along the floor of the ethmoid sinus removing polypoid mucosa and chronic appearing mucopurulent disease.  The superior aspect of the ethmoid sinus identified and using a  45 degree telescope and a curved microdebrider, dissection was then carried from posterior to anterior.  Again, there was a moderate amount of polypoid material which was resected and all ethmoid cells were opened.  The patient had a small hypoplastic right frontal sinus.  The underlying ethmoid air cells were resected and the frontal sinus was inspected using the 45 degree telescope.  There was no further infection or polyps. Attention then turned to lateral nasal wall.  The natural ostium of the sinus was identified. This was enlarged in inferior posterior direction.  Polypoid material was then resected using a curved microdebrider from the ostiomeatal complex and maxillary sinus clearing the sinus completely, no evidence of further infection.  Bilateral inferior turbinate reduction was then performed with cautery set at 12 watts.  Two submucosal passes were made in each inferior turbinate.  Anterior incisions were then created, overlying soft tissue elevated, and a small amount of turbinate bone was resected.  The turbinates then outfractured creating more patent nasal cavity.  The patient's nasal cavity was thoroughly inspected, surgical debris was cleared.  There was minimal bleeding.  The surgical sponge count was correct.  Bilateral Kennedy sinus packs were then placed in the common ethmoid cavities after application of Bactroban ointment and were hydrated with sterile saline.  Bilateral Doyle nasal septal splints were placed and sutured in position with a 3-0 Ethilon suture.  The patient's nasopharynx and oral cavity were suctioned.  An orogastric tube was passed and the contents aspirated.  The patient then awakened from anesthetic.  She was extubated and transferred from the operating room to the recovery room in stable condition.  There were no complications and blood loss was approximately 150 mL.          ______________________________ Early Chars. Wilburn Cornelia,  M.D.     DLS/MEDQ  D:  [id number]  T:  06/09/2015  Job:  [id number]  cc:   Christena Deem. Melvyn Novas, MD, FCCP

## 2015-06-09 NOTE — Transfer of Care (Signed)
Immediate Anesthesia Transfer of Care Note  Patient: Valerie Patterson  Procedure(s) Performed: Procedure(s): BILATERAL ENDOSCOPIC SINUS SURGERY  (Bilateral) NASAL SEPTOPLASTY BILATERAL INFERIOR TURBINATE REDUCTION WITH FUSION SCAN  (Bilateral)  Patient Location: PACU  Anesthesia Type:General  Level of Consciousness: awake, alert , patient cooperative and responds to stimulation  Airway & Oxygen Therapy: Patient Spontanous Breathing and Patient connected to face mask oxygen  Post-op Assessment: Report given to RN, Post -op Vital signs reviewed and stable and Patient moving all extremities X 4  Post vital signs: Reviewed and stable  Last Vitals:  Filed Vitals:   06/09/15 0821  BP: 155/60  Pulse: 70  Temp: 36.4 C  Resp: 16    Complications: No apparent anesthesia complications

## 2015-06-09 NOTE — Anesthesia Preprocedure Evaluation (Addendum)
Anesthesia Evaluation  Patient identified by MRN, date of birth, ID band Patient awake    Reviewed: Allergy & Precautions, NPO status , Patient's Chart, lab work & pertinent test results  History of Anesthesia Complications Negative for: history of anesthetic complications  Airway Mallampati: II  TM Distance: >3 FB Neck ROM: Full    Dental  (+) Teeth Intact, Dental Advisory Given   Pulmonary asthma ,    Pulmonary exam normal        Cardiovascular negative cardio ROS Normal cardiovascular exam Rhythm:Regular     Neuro/Psych Depression negative neurological ROS     GI/Hepatic negative GI ROS, Neg liver ROS,   Endo/Other  negative endocrine ROS  Renal/GU      Musculoskeletal   Abdominal   Peds  Hematology   Anesthesia Other Findings   Reproductive/Obstetrics                             Anesthesia Physical  Anesthesia Plan  ASA: II  Anesthesia Plan: General   Post-op Pain Management:    Induction: Intravenous  Airway Management Planned: Oral ETT  Additional Equipment:   Intra-op Plan:   Post-operative Plan: Extubation in OR  Informed Consent: I have reviewed the patients History and Physical, chart, labs and discussed the procedure including the risks, benefits and alternatives for the proposed anesthesia with the patient or authorized representative who has indicated his/her understanding and acceptance.   Dental advisory given  Plan Discussed with: CRNA  Anesthesia Plan Comments:         Anesthesia Quick Evaluation

## 2015-06-09 NOTE — Anesthesia Postprocedure Evaluation (Signed)
Anesthesia Post Note  Patient: Valerie Patterson  Procedure(s) Performed: Procedure(s) (LRB): BILATERAL ENDOSCOPIC SINUS SURGERY  (Bilateral) NASAL SEPTOPLASTY BILATERAL INFERIOR TURBINATE REDUCTION WITH FUSION SCAN  (Bilateral)  Patient location during evaluation: PACU Anesthesia Type: General Level of consciousness: sedated and patient cooperative Pain management: pain level controlled Vital Signs Assessment: post-procedure vital signs reviewed and stable Respiratory status: spontaneous breathing Cardiovascular status: stable Anesthetic complications: no    Last Vitals:  Filed Vitals:   06/09/15 1335 06/09/15 1359  BP:  133/65  Pulse:  72  Temp: 36.7 C   Resp:      Last Pain:  Filed Vitals:   06/09/15 1401  PainSc: 0-No pain                 Nolon Nations

## 2015-06-09 NOTE — H&P (Signed)
Valerie Patterson is an 64 y.o. female.   Chief Complaint: Nasal Obstruction and chronic sinusitis HPI: Progressive symptoms of Nasal Obstruction and chronic sinusitis  Past Medical History  Diagnosis Date  . Asthma   . Bronchitis   . Hypercholesterolemia   . Wears glasses   . Eczema   . Depression   . Osteoarthritis of both hips   . Pneumonia   . Coarse tremors     pt stated from inhalers  . GERD (gastroesophageal reflux disease)   . Urinary incontinence     minimally    Past Surgical History  Procedure Laterality Date  . Tonsillectomy    . Anal fissure repair    . Colonoscopy    . Incontinence surgery    . Varicose veins    . Total hip arthroplasty Right 01/04/2015  . Total hip arthroplasty Right 01/05/2015    Procedure: TOTAL HIP ARTHROPLASTY;  Surgeon: Garald Balding, MD;  Location: [facility];  Service: Orthopedics;  Laterality: Right;    Family History  Problem Relation Age of Onset  . Cancer - Colon Mother   . Diabetes Father   . Cancer - Prostate Father   . Sleep apnea Father   . Asthma Sister    Social History:  reports that she has never smoked. She has never used smokeless tobacco. She reports that she drinks alcohol. She reports that she does not use illicit drugs.  Allergies:  Allergies  Allergen Reactions  . Dilaudid [Hydromorphone Hcl] Anaphylaxis  . Compazine [Prochlorperazine] Other (See Comments)    Drew mouth to one side, extrapyramidal movement  . Pravastatin Other (See Comments)    Muscle aches  . Caffeine Other (See Comments)    Stomach upset (*pt states dr told her to avoid anything with caffeine*)  . Erythromycin Nausea And Vomiting  . Augmentin [Amoxicillin-Pot Clavulanate] Other (See Comments)    Stomach pain and diarrhea  . Sulfa Antibiotics Rash    Medications Prior to Admission  Medication Sig Dispense Refill  . cetirizine (ZYRTEC) 10 MG tablet Take 10 mg by mouth daily.    . methylPREDNISolone (MEDROL DOSEPAK) 4 MG TBPK tablet Take  4 mg by mouth taper from 4 doses each day to 1 dose and stop.    . mometasone-formoterol (DULERA) 100-5 MCG/ACT AERO Take 2 puffs first thing in am and then another 2 puffs about 12 hours later. 1 Inhaler 11  . Multiple Vitamins-Minerals (ICAPS AREDS 2) CAPS Take 2 capsules by mouth daily.    Marland Kitchen OVER THE COUNTER MEDICATION Take 1 tablet by mouth daily. [employer] brand of Estrablend    . ranitidine (ZANTAC) 150 MG tablet One at bedtime (Patient taking differently: Take 150-300 mg by mouth 2 (two) times daily. 150 mg in the morning and 300 mg in the evening)    . sertraline (ZOLOFT) 100 MG tablet Take 100 mg by mouth daily before breakfast.     . sodium chloride (OCEAN) 0.65 % SOLN nasal spray Place 1 spray into both nostrils daily as needed for congestion.     . triamcinolone (NASACORT ALLERGY 24HR) 55 MCG/ACT AERO nasal inhaler Place 2 sprays into the nose at bedtime.    Marland Kitchen albuterol (PROAIR HFA) 108 (90 BASE) MCG/ACT inhaler 2 puffs every 4 hours as needed only  if your can't catch your breath 1 Inhaler 1  . budesonide (RHINOCORT AQUA) 32 MCG/ACT nasal spray Place 2 sprays into both nostrils daily.    . methocarbamol (ROBAXIN) 500 MG tablet  Take 1 tablet (500 mg total) by mouth every 8 (eight) hours as needed for muscle spasms. (Patient not taking: Reported on 03/19/2015) 30 tablet 0  . oxyCODONE (OXY IR/ROXICODONE) 5 MG immediate release tablet Take 1-2 tablets (5-10 mg total) by mouth every 4 (four) hours as needed for breakthrough pain. (Patient not taking: Reported on 06/02/2015) 90 tablet 0  . rivaroxaban (XARELTO) 10 MG TABS tablet Take 1 tablet (10 mg total) by mouth daily with breakfast. (Patient not taking: Reported on 03/19/2015) 10 tablet 0    No results found for this or any previous visit (from the past 48 hour(s)). No results found.  Review of Systems  Constitutional: Negative.   HENT: Negative.   Respiratory: Negative.   Cardiovascular: Negative.   Gastrointestinal: Negative.    Musculoskeletal: Negative.     Blood pressure 155/60, pulse 70, temperature 97.5 F (36.4 C), temperature source Oral, resp. rate 16, weight 65.318 kg (144 lb), SpO2 98 %. Physical Exam  Constitutional: She is oriented to person, place, and time. She appears well-developed and well-nourished.  HENT:  Deviated septum and turbinate hypertrophy  Neck: Normal range of motion. Neck supple.  Cardiovascular: Normal rate.   Respiratory: Effort normal.  GI: Soft.  Musculoskeletal: Normal range of motion.  Neurological: She is alert and oriented to person, place, and time.     Assessment/Plan Adm for OP ESS, septoplasty and turbinate reduction under GA.  Faron Whitelock 06/09/2015, 10:05 AM

## 2015-06-09 NOTE — Brief Op Note (Signed)
06/09/2015  12:24 PM  PATIENT:  Valerie Patterson  64 y.o. 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   PROCEDURE:  Procedure(s): BILATERAL ENDOSCOPIC SINUS SURGERY  (Bilateral) NASAL SEPTOPLASTY BILATERAL INFERIOR TURBINATE REDUCTION WITH FUSION SCAN  (Bilateral)  SURGEON:  Surgeon(s) and Role:    * Jerrell Belfast, MD - Primary  PHYSICIAN ASSISTANT:   ASSISTANTS: none   ANESTHESIA:   general  EBL:  Total I/O In: 1150 [I.V.:1150] Out: 25 [Blood:25] EBL:150cc  BLOOD ADMINISTERED:none  DRAINS: none   LOCAL MEDICATIONS USED:  LIDOCAINE  and Amount: 9 ml  SPECIMEN:  Source of Specimen:  Bil sinus contents  DISPOSITION OF SPECIMEN:  PATHOLOGY  COUNTS:  YES  TOURNIQUET:  * No tourniquets in log *  DICTATION: .Other Dictation: Dictation Number [id number]  PLAN OF CARE: Discharge to home after PACU  PATIENT DISPOSITION:  PACU - hemodynamically stable.   Delay start of Pharmacological VTE agent (>24hrs) due to surgical blood loss or risk of bleeding: not applicable

## 2015-06-14 ENCOUNTER — Encounter (HOSPITAL_COMMUNITY): Payer: Self-pay | Admitting: Otolaryngology

## 2015-10-10 IMAGING — CR DG PORTABLE PELVIS
2 series · 2 of 2 positions shown · non-contrast
Comparison: None.

CLINICAL DATA: Status post right total hip arthroplasty.

EXAM:
PORTABLE PELVIS 1-2 VIEWS

[AP (1 of 2)]
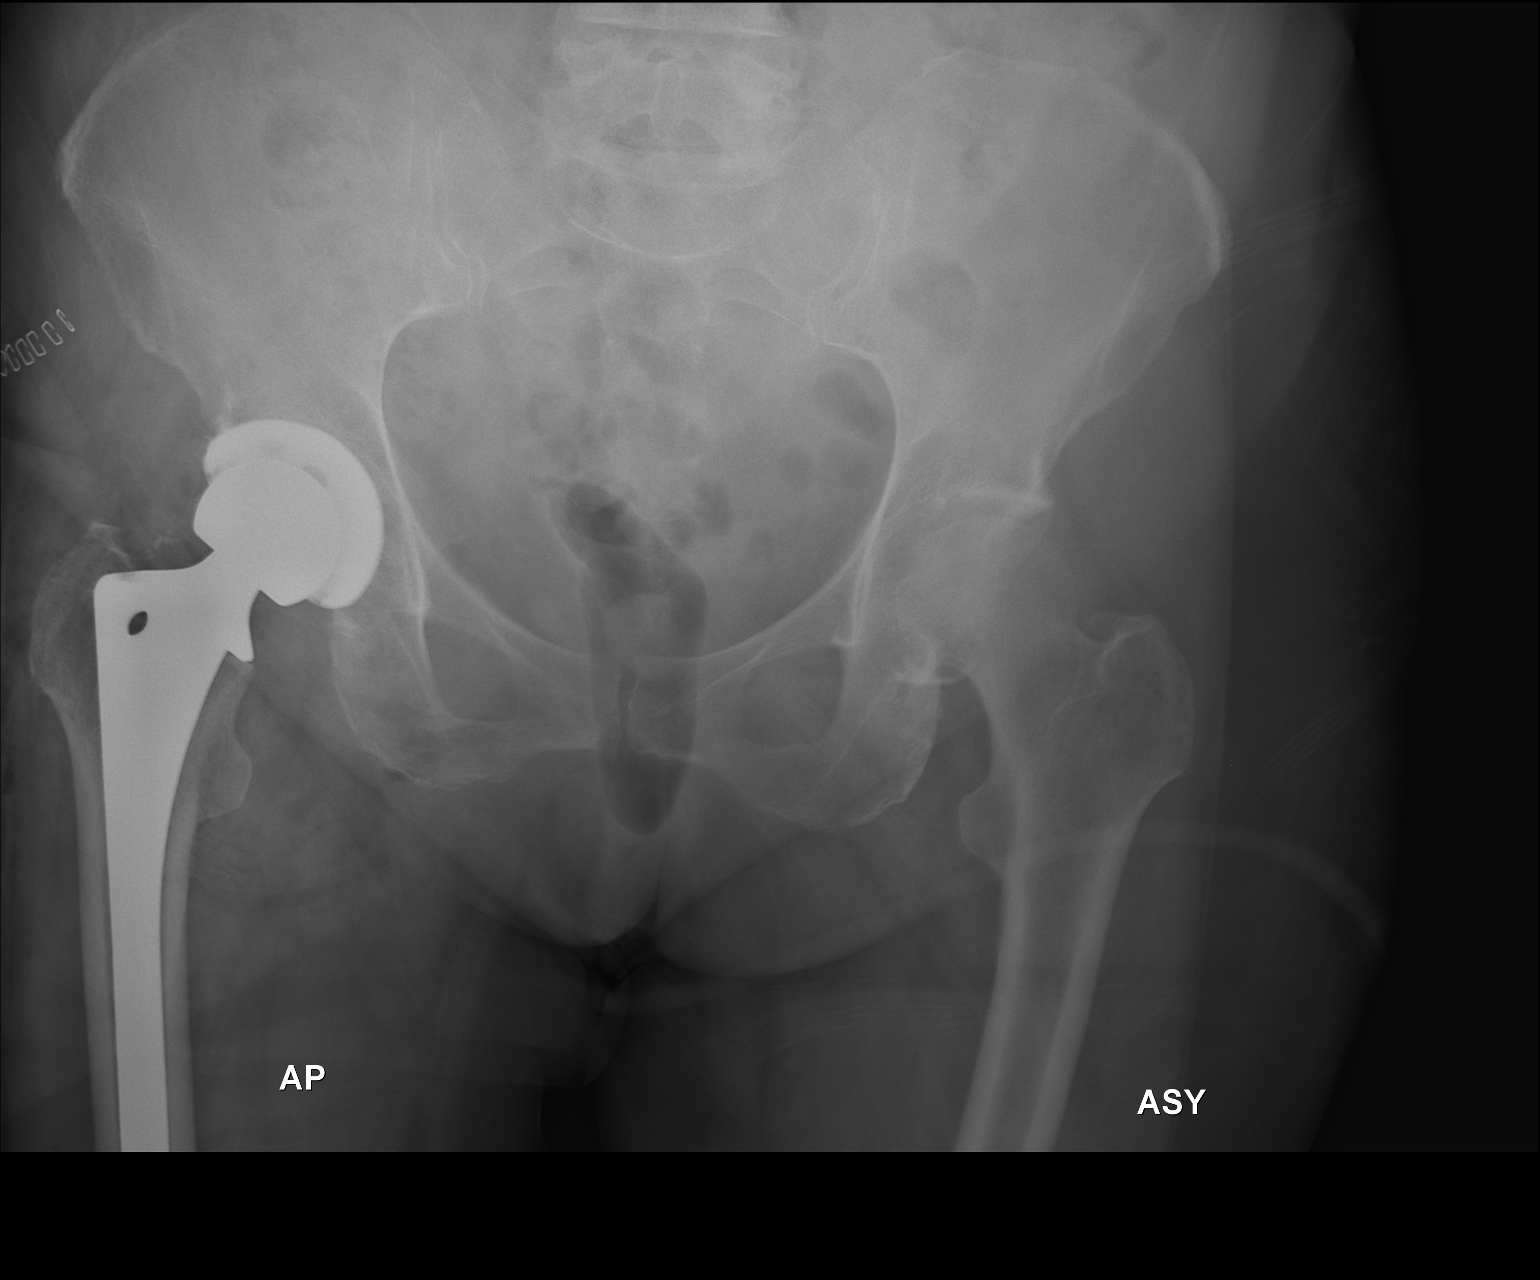

[AP (2 of 2)]
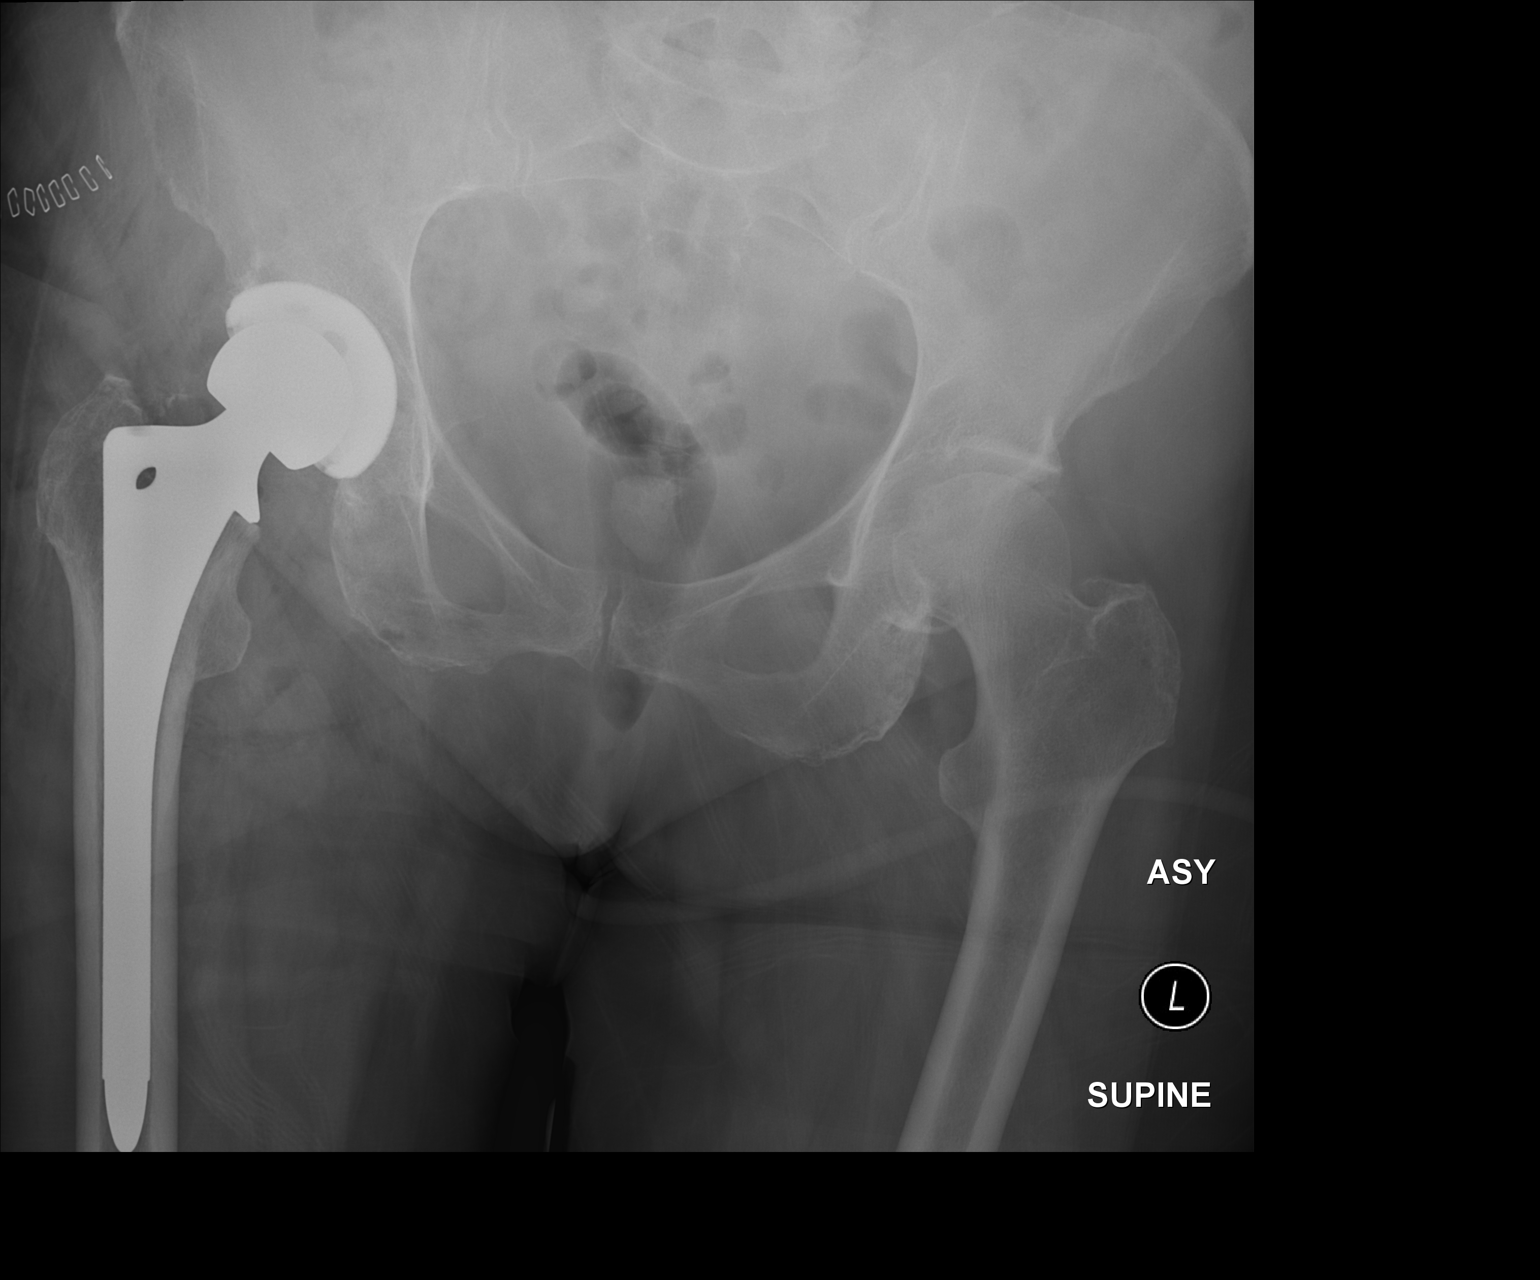

[2 of 2 positions shown; findings below may reference images not displayed]

FINDINGS: Right total hip arthroplasty identified without complicating
features.

The prosthesis appears located on this single view.

No fracture or dislocation identified.
IMPRESSION: Right total hip arthroplasty without complicating features.

## 2016-10-31 ENCOUNTER — Other Ambulatory Visit: Payer: Self-pay | Admitting: Orthopedic Surgery

## 2016-11-03 NOTE — Pre-Procedure Instructions (Signed)
Valerie Patterson  11/03/2016      [facility] [place] [address] [place] [address] Phone: [phone] Fax: [place], [place] [address] [place] [address] Phone: [phone] Fax: [phone]    Your procedure is scheduled on Mon, April 30 @ 9:15 AM  Report to [facility] Admitting at 7:15 AM  Call this number if you have problems the morning of surgery:  [phone]   Remember:  Do not eat food or drink liquids after midnight.  Take these medicines the morning of surgery with A SIP OF WATER Albuterol<Bring Your Inhaler With You>,Zyrtec(Cetirizine),Dulera,Sertraline(Zoloft),and Nasacort             Stop taking any Vitamins or Herbal Medications. No Goody's,BC's,Aleve,Advil,Motrin,Ibuprofen,or Fish Oil.    Do not wear jewelry, make-up or nail polish.  Do not wear lotions, powders,perfumes, or deoderant.  Do not shave 48 hours prior to surgery.   Do not bring valuables to the hospital.  [facility] is not responsible for any belongings or valuables.  Contacts, dentures or bridgework may not be worn into surgery.  Leave your suitcase in the car.  After surgery it may be brought to your room.  For patients admitted to the hospital, discharge time will be determined by your treatment team.  Patients discharged the day of surgery will not be allowed to drive home.    [facility]  Before surgery, you can play an important role.  Because skin is not sterile, your skin needs to be as free of germs as possible.  You can reduce the number of germs on you skin by washing with CHG (chlorahexidine gluconate) soap before surgery.  CHG is an antiseptic cleaner which kills germs and bonds with the skin to continue killing germs even after washing.  Please DO NOT use if you have an allergy to CHG  or antibacterial soaps.  If your skin becomes reddened/irritated stop using the CHG and inform your nurse when you arrive at Short Stay.  Do not shave (including legs and underarms) for at least 48 hours prior to the first CHG shower.  You may shave your face.  Please follow these instructions carefully:   1.  Shower with CHG Soap the night before surgery and the                                morning of Surgery.  2.  If you choose to wash your hair, wash your hair first as usual with your       normal shampoo.  3.  After you shampoo, rinse your hair and body thoroughly to remove the                      Shampoo.  4.  Use CHG as you would any other liquid soap.  You can apply chg directly       to the skin and wash gently with scrungie or a clean washcloth.  5.  Apply the CHG Soap to your body ONLY FROM THE NECK DOWN.        Do not use on open wounds or open sores.  Avoid contact with your eyes,       ears, mouth and genitals (private parts).  Wash genitals (private  parts)       with your normal soap.  6.  Wash thoroughly, paying special attention to the area where your surgery        will be performed.  7.  Thoroughly rinse your body with warm water from the neck down.  8.  DO NOT shower/wash with your normal soap after using and rinsing off       the CHG Soap.  9.  Pat yourself dry with a clean towel.            10.  Wear clean pajamas.            11.  Place clean sheets on your bed the night of your first shower and do not        sleep with pets.  Day of Surgery  Do not apply any lotions/deoderants the morning of surgery.  Please wear clean clothes to the hospital/surgery center.    Please read over the following fact sheets that you were given. Pain Booklet, Coughing and Deep Breathing, MRSA Information and Surgical Site Infection Prevention

## 2016-11-06 ENCOUNTER — Encounter (HOSPITAL_COMMUNITY): Payer: Self-pay

## 2016-11-06 ENCOUNTER — Encounter (HOSPITAL_COMMUNITY)
Admission: RE | Admit: 2016-11-06 | Discharge: 2016-11-06 | Disposition: A | Discharge status: Home / Self Care | Payer: Medicare Other | Source: Ambulatory Visit | Attending: Orthopedic Surgery | Admitting: Orthopedic Surgery

## 2016-11-06 DIAGNOSIS — J329 Chronic sinusitis, unspecified: Secondary | ICD-10-CM | POA: Insufficient documentation

## 2016-11-06 DIAGNOSIS — Z01812 Encounter for preprocedural laboratory examination: Secondary | ICD-10-CM | POA: Diagnosis present

## 2016-11-06 DIAGNOSIS — M7989 Other specified soft tissue disorders: Secondary | ICD-10-CM | POA: Diagnosis not present

## 2016-11-06 DIAGNOSIS — Z96652 Presence of left artificial knee joint: Secondary | ICD-10-CM | POA: Diagnosis not present

## 2016-11-06 DIAGNOSIS — R05 Cough: Secondary | ICD-10-CM | POA: Diagnosis not present

## 2016-11-06 DIAGNOSIS — M1712 Unilateral primary osteoarthritis, left knee: Secondary | ICD-10-CM | POA: Diagnosis not present

## 2016-11-06 DIAGNOSIS — J342 Deviated nasal septum: Secondary | ICD-10-CM | POA: Insufficient documentation

## 2016-11-06 DIAGNOSIS — J4530 Mild persistent asthma, uncomplicated: Secondary | ICD-10-CM | POA: Insufficient documentation

## 2016-11-06 DIAGNOSIS — M1611 Unilateral primary osteoarthritis, right hip: Secondary | ICD-10-CM | POA: Insufficient documentation

## 2016-11-06 HISTORY — DX: Effusion, unspecified joint: M25.40

## 2016-11-06 HISTORY — DX: Restless legs syndrome: G25.81

## 2016-11-06 HISTORY — DX: Pain in unspecified joint: M25.50

## 2016-11-06 HISTORY — DX: Allergy, unspecified, initial encounter: T78.40XA

## 2016-11-06 LAB — CBC WITH DIFFERENTIAL/PLATELET
BASOS ABS: 0.1 10*3/uL (ref 0.0–0.1)
BASOS PCT: 1 %
EOS PCT: 6 %
Eosinophils Absolute: 0.4 10*3/uL (ref 0.0–0.7)
HCT: 39.8 % (ref 36.0–46.0)
Hemoglobin: 12.8 g/dL (ref 12.0–15.0)
Lymphocytes Relative: 39 %
Lymphs Abs: 2.7 10*3/uL (ref 0.7–4.0)
MCH: 27.6 pg (ref 26.0–34.0)
MCHC: 32.2 g/dL (ref 30.0–36.0)
MCV: 86 fL (ref 78.0–100.0)
MONO ABS: 0.4 10*3/uL (ref 0.1–1.0)
Monocytes Relative: 6 %
NEUTROS ABS: 3.3 10*3/uL (ref 1.7–7.7)
Neutrophils Relative %: 48 %
PLATELETS: 244 10*3/uL (ref 150–400)
RBC: 4.63 MIL/uL (ref 3.87–5.11)
RDW: 13.9 % (ref 11.5–15.5)
WBC: 6.8 10*3/uL (ref 4.0–10.5)

## 2016-11-06 LAB — COMPREHENSIVE METABOLIC PANEL
ALBUMIN: 4.3 g/dL (ref 3.5–5.0)
ALT: 18 U/L (ref 14–54)
AST: 23 U/L (ref 15–41)
Alkaline Phosphatase: 79 U/L (ref 38–126)
Anion gap: 8 (ref 5–15)
BUN: 14 mg/dL (ref 6–20)
CHLORIDE: 104 mmol/L (ref 101–111)
CO2: 27 mmol/L (ref 22–32)
Calcium: 9.9 mg/dL (ref 8.9–10.3)
Creatinine, Ser: 0.63 mg/dL (ref 0.44–1.00)
Glucose, Bld: 93 mg/dL (ref 65–99)
POTASSIUM: 3.9 mmol/L (ref 3.5–5.1)
Sodium: 139 mmol/L (ref 135–145)
Total Bilirubin: 0.5 mg/dL (ref 0.3–1.2)
Total Protein: 6.9 g/dL (ref 6.5–8.1)

## 2016-11-06 LAB — SURGICAL PCR SCREEN
MRSA, PCR: NEGATIVE
STAPHYLOCOCCUS AUREUS: POSITIVE — AB

## 2016-11-06 MED ORDER — CHLORHEXIDINE GLUCONATE 4 % EX LIQD: 60.0000 mL | Freq: Once | CUTANEOUS | Status: DC

## 2016-11-06 NOTE — Progress Notes (Addendum)
Cardiologist denies   Medical Md is Dr.Mark Viann Shove denies  Stress test denies  Heart cath denies  EKG last one about 2 yrs ago  CXR denies in past yr

## 2016-11-06 NOTE — Progress Notes (Signed)
Mupirocin [facility] [place]

## 2016-11-10 MED ORDER — GABAPENTIN 300 MG PO CAPS
300.0000 mg | ORAL_CAPSULE | ORAL | Status: AC
  Administered 2016-11-13: 300 mg via ORAL
  Filled 2016-11-10: qty 1

## 2016-11-10 MED ORDER — ACETAMINOPHEN 500 MG PO TABS
1000.0000 mg | ORAL_TABLET | ORAL | Status: AC
  Administered 2016-11-13: 1000 mg via ORAL
  Filled 2016-11-10: qty 2

## 2016-11-10 MED ORDER — CLINDAMYCIN PHOSPHATE 900 MG/50ML IV SOLN
900.0000 mg | INTRAVENOUS | Status: AC
  Administered 2016-11-13: 900 mg via INTRAVENOUS
  Filled 2016-11-10: qty 50

## 2016-11-10 MED ORDER — TRANEXAMIC ACID 1000 MG/10ML IV SOLN
1000.0000 mg | INTRAVENOUS | Status: AC
  Administered 2016-11-13: 1000 mg via INTRAVENOUS
  Filled 2016-11-10: qty 10

## 2016-11-10 MED ORDER — BUPIVACAINE LIPOSOME 1.3 % IJ SUSP
20.0000 mL | INTRAMUSCULAR | Status: AC
  Administered 2016-11-13: 20 mL
  Filled 2016-11-10: qty 20

## 2016-11-10 MED ORDER — DEXAMETHASONE SODIUM PHOSPHATE 10 MG/ML IJ SOLN
8.0000 mg | INTRAMUSCULAR | Status: AC
  Administered 2016-11-13: 8 mg via INTRAVENOUS
  Filled 2016-11-10: qty 1

## 2016-11-12 NOTE — H&P (Signed)
Valerie Patterson MRN:  [id number] DOB/SEX:  10-01-1950/female  CHIEF COMPLAINT:  Painful left Knee  HISTORY: Patient is a 66 y.o. female presented with a history of pain in the left knee. Onset of symptoms was gradual starting a few years ago with gradually worsening course since that time. Patient has been treated conservatively with over-the-counter NSAIDs and activity modification. Patient currently rates pain in the knee at 10 out of 10 with activity. There is pain at night.  PAST MEDICAL HISTORY: Patient Active Problem List   Diagnosis Date Noted  . Sinusitis, chronic 06/09/2015    Class: Chronic  . Deviated nasal septum 06/09/2015    Class: Chronic  . Primary osteoarthritis of right hip 01/05/2015  . S/P total hip arthroplasty 01/05/2015  . Mild persistent asthma without complication 12/45/8099  . Upper airway cough syndrome 12/18/2014   Past Medical History:  Diagnosis Date  . Allergy    takes Zyrtec daily  . Asthma   . Bronchitis    hx of  . Coarse tremors    pt stated from inhalers  . Depression    takes Zoloft daily  . GERD (gastroesophageal reflux disease)    takes Zantac daily  . Hypercholesterolemia    not on any meds  . Joint pain   . Joint swelling   . Osteoarthritis of both hips   . Restless leg   . Urinary incontinence    minimally   Past Surgical History:  Procedure Laterality Date  . ANAL FISSURE REPAIR    . COLONOSCOPY    . INCONTINENCE SURGERY    . NASAL SEPTOPLASTY W/ TURBINOPLASTY Bilateral 06/09/2015   Procedure: NASAL SEPTOPLASTY BILATERAL INFERIOR TURBINATE REDUCTION WITH FUSION SCAN ;  Surgeon: Jerrell Belfast, MD;  Location: [place];  Service: ENT;  Laterality: Bilateral;  . SINUS ENDO W/FUSION Bilateral 06/09/2015   Procedure: BILATERAL ENDOSCOPIC SINUS SURGERY ;  Surgeon: Jerrell Belfast, MD;  Location: [place];  Service: ENT;  Laterality: Bilateral;  . TONSILLECTOMY    . TOTAL HIP ARTHROPLASTY Right 01/04/2015  . TOTAL HIP ARTHROPLASTY  Right 01/05/2015   Procedure: TOTAL HIP ARTHROPLASTY;  Surgeon: Garald Balding, MD;  Location: [place];  Service: Orthopedics;  Laterality: Right;  . varicose veins       MEDICATIONS:   No prescriptions prior to admission.    ALLERGIES:   Allergies  Allergen Reactions  . Dilaudid [Hydromorphone Hcl] Anaphylaxis  . Hydromorphone Anaphylaxis  . Augmentin [Amoxicillin-Pot Clavulanate] Diarrhea and Nausea And Vomiting    STOMACH ACHE  . Compazine [Prochlorperazine] Other (See Comments)    EXTRAPYRAMIDAL MOVEMENTS DREW MOUTH TO ONE SIDE  . Pravastatin Other (See Comments)    MYALGIAS  . Xarelto [Rivaroxaban]     BLEEDING per PATIENT REPORT "PLACED ON LOW-DOSE ASA INSTEAD"  . Erythromycin Base     UNSPECIFIED REACTION   . Sulfamethoxazole     UNSPECIFIED REACTION   . Caffeine Nausea And Vomiting    UPSET STOMACH PATIENT AVOIDS ANY CAFFEINE  . Erythromycin Nausea And Vomiting  . Sulfa Antibiotics Rash    REVIEW OF SYSTEMS:  A comprehensive review of systems was negative except for: Musculoskeletal: positive for arthralgias and bone pain   FAMILY HISTORY:   Family History  Problem Relation Age of Onset  . Cancer - Colon Mother   . Diabetes Father   . Cancer - Prostate Father   . Sleep apnea Father   . Asthma Sister     SOCIAL HISTORY:   Social  History  Substance Use Topics  . Smoking status: Never Smoker  . Smokeless tobacco: Never Used  . Alcohol use Yes     Comment: rare     EXAMINATION:  Vital signs in last 24 hours:    There were no vitals taken for this visit.  General Appearance:    Alert, cooperative, no distress, appears stated age  Head:    Normocephalic, without obvious abnormality, atraumatic  Eyes:    PERRL, conjunctiva/corneas clear, EOM's intact, fundi    benign, both eyes  Ears:    Normal TM's and external ear canals, both ears  Nose:   Nares normal, septum midline, mucosa normal, no drainage    or sinus tenderness  Throat:   Lips, mucosa,  and tongue normal; teeth and gums normal  Neck:   Supple, symmetrical, trachea midline, no adenopathy;    thyroid:  no enlargement/tenderness/nodules; no carotid   bruit or JVD  Back:     Symmetric, no curvature, ROM normal, no CVA tenderness  Lungs:     Clear to auscultation bilaterally, respirations unlabored  Chest Wall:    No tenderness or deformity   Heart:    Regular rate and rhythm, S1 and S2 normal, no murmur, rub   or gallop  Breast Exam:    No tenderness, masses, or nipple abnormality  Abdomen:     Soft, non-tender, bowel sounds active all four quadrants,    no masses, no organomegaly  Genitalia:    Normal female without lesion, discharge or tenderness  Rectal:    Normal tone, no masses or tenderness;   guaiac negative stool  Extremities:   Extremities normal, atraumatic, no cyanosis or edema  Pulses:   2+ and symmetric all extremities  Skin:   Skin color, texture, turgor normal, no rashes or lesions  Lymph nodes:   Cervical, supraclavicular, and axillary nodes normal  Neurologic:   CNII-XII intact, normal strength, sensation and reflexes    throughout    Musculoskeletal:  ROM 0-120, Ligaments intact,  Imaging Review Plain radiographs demonstrate severe degenerative joint disease of the left knee medial compartment. The overall alignment is mild varus. The bone quality appears to be excellent for age and reported activity level.  Assessment/Plan: Primary osteoarthritis, left knee, medial compartment  The patient history, physical examination and imaging studies are consistent with advanced degenerative joint disease of the left knee. The patient has failed conservative treatment.  The clearance notes were reviewed.  After discussion with the patient it was felt that partial Knee Replacement was indicated. The procedure,  risks, and benefits of partial knee arthroplasty were presented and reviewed. The risks including but not limited to aseptic loosening, infection, blood clots,  vascular injury, stiffness, patella tracking problems complications among others were discussed. The patient acknowledged the explanation, agreed to proceed with the plan.  Donia Ast 11/12/2016, 9:26 PM

## 2016-11-13 ENCOUNTER — Encounter (HOSPITAL_COMMUNITY)
Admission: RE | Disposition: A | Discharge status: Home / Self Care | Payer: Self-pay | Source: Ambulatory Visit | Attending: Orthopedic Surgery

## 2016-11-13 ENCOUNTER — Ambulatory Visit (HOSPITAL_COMMUNITY): Payer: Medicare Other | Admitting: Anesthesiology

## 2016-11-13 ENCOUNTER — Encounter (HOSPITAL_COMMUNITY): Payer: Self-pay | Admitting: Certified Registered"

## 2016-11-13 ENCOUNTER — Observation Stay (HOSPITAL_COMMUNITY)
Admission: RE | Admit: 2016-11-13 | Discharge: 2016-11-14 | Disposition: A | Discharge status: Home / Self Care | Payer: Medicare Other | Source: Ambulatory Visit | Attending: Orthopedic Surgery | Admitting: Orthopedic Surgery

## 2016-11-13 DIAGNOSIS — Z96641 Presence of right artificial hip joint: Secondary | ICD-10-CM | POA: Diagnosis not present

## 2016-11-13 DIAGNOSIS — Z882 Allergy status to sulfonamides status: Secondary | ICD-10-CM | POA: Diagnosis not present

## 2016-11-13 DIAGNOSIS — R05 Cough: Secondary | ICD-10-CM | POA: Insufficient documentation

## 2016-11-13 DIAGNOSIS — J342 Deviated nasal septum: Secondary | ICD-10-CM | POA: Insufficient documentation

## 2016-11-13 DIAGNOSIS — Z825 Family history of asthma and other chronic lower respiratory diseases: Secondary | ICD-10-CM | POA: Insufficient documentation

## 2016-11-13 DIAGNOSIS — J329 Chronic sinusitis, unspecified: Secondary | ICD-10-CM | POA: Insufficient documentation

## 2016-11-13 DIAGNOSIS — Z96652 Presence of left artificial knee joint: Secondary | ICD-10-CM | POA: Diagnosis not present

## 2016-11-13 DIAGNOSIS — F329 Major depressive disorder, single episode, unspecified: Secondary | ICD-10-CM | POA: Diagnosis not present

## 2016-11-13 DIAGNOSIS — M1712 Unilateral primary osteoarthritis, left knee: Secondary | ICD-10-CM | POA: Diagnosis present

## 2016-11-13 DIAGNOSIS — Z880 Allergy status to penicillin: Secondary | ICD-10-CM | POA: Insufficient documentation

## 2016-11-13 DIAGNOSIS — Z881 Allergy status to other antibiotic agents status: Secondary | ICD-10-CM | POA: Insufficient documentation

## 2016-11-13 DIAGNOSIS — Z832 Family history of diseases of the blood and blood-forming organs and certain disorders involving the immune mechanism: Secondary | ICD-10-CM | POA: Diagnosis not present

## 2016-11-13 DIAGNOSIS — Z800 Family history of malignant neoplasm of digestive organs: Secondary | ICD-10-CM | POA: Insufficient documentation

## 2016-11-13 DIAGNOSIS — E7800 Pure hypercholesterolemia, unspecified: Secondary | ICD-10-CM | POA: Diagnosis not present

## 2016-11-13 DIAGNOSIS — J4530 Mild persistent asthma, uncomplicated: Secondary | ICD-10-CM | POA: Diagnosis not present

## 2016-11-13 DIAGNOSIS — K219 Gastro-esophageal reflux disease without esophagitis: Secondary | ICD-10-CM | POA: Diagnosis not present

## 2016-11-13 DIAGNOSIS — Z888 Allergy status to other drugs, medicaments and biological substances status: Secondary | ICD-10-CM | POA: Diagnosis not present

## 2016-11-13 DIAGNOSIS — Z833 Family history of diabetes mellitus: Secondary | ICD-10-CM | POA: Insufficient documentation

## 2016-11-13 DIAGNOSIS — Z7982 Long term (current) use of aspirin: Secondary | ICD-10-CM | POA: Diagnosis not present

## 2016-11-13 DIAGNOSIS — G2581 Restless legs syndrome: Secondary | ICD-10-CM | POA: Insufficient documentation

## 2016-11-13 DIAGNOSIS — Z885 Allergy status to narcotic agent status: Secondary | ICD-10-CM | POA: Insufficient documentation

## 2016-11-13 DIAGNOSIS — Z79899 Other long term (current) drug therapy: Secondary | ICD-10-CM | POA: Diagnosis not present

## 2016-11-13 DIAGNOSIS — Z8042 Family history of malignant neoplasm of prostate: Secondary | ICD-10-CM | POA: Insufficient documentation

## 2016-11-13 DIAGNOSIS — G252 Other specified forms of tremor: Secondary | ICD-10-CM | POA: Diagnosis not present

## 2016-11-13 HISTORY — PX: PARTIAL KNEE ARTHROPLASTY: SHX2174

## 2016-11-13 SURGERY — ARTHROPLASTY, KNEE, UNICOMPARTMENTAL
Anesthesia: Spinal | Laterality: Left

## 2016-11-13 MED ORDER — PROPOFOL 10 MG/ML IV BOLUS
INTRAVENOUS | Status: AC
  Filled 2016-11-13: qty 20

## 2016-11-13 MED ORDER — PHENYLEPHRINE HCL 10 MG/ML IJ SOLN
INTRAMUSCULAR | Status: DC | PRN
  Administered 2016-11-13: 80 ug via INTRAVENOUS
  Administered 2016-11-13: 120 ug via INTRAVENOUS
  Administered 2016-11-13 (×3): 80 ug via INTRAVENOUS
  Administered 2016-11-13: 120 ug via INTRAVENOUS
  Administered 2016-11-13 (×3): 80 ug via INTRAVENOUS
  Administered 2016-11-13: 120 ug via INTRAVENOUS

## 2016-11-13 MED ORDER — SODIUM CHLORIDE 0.9 % IJ SOLN
INTRAMUSCULAR | Status: DC | PRN
  Administered 2016-11-13: 20 mL via INTRAVENOUS

## 2016-11-13 MED ORDER — 0.9 % SODIUM CHLORIDE (POUR BTL) OPTIME
TOPICAL | Status: DC | PRN
  Administered 2016-11-13: 1000 mL

## 2016-11-13 MED ORDER — METHOCARBAMOL 1000 MG/10ML IJ SOLN
500.0000 mg | Freq: Four times a day (QID) | INTRAMUSCULAR | Status: DC | PRN
  Filled 2016-11-13: qty 5

## 2016-11-13 MED ORDER — PHENYLEPHRINE 40 MCG/ML (10ML) SYRINGE FOR IV PUSH (FOR BLOOD PRESSURE SUPPORT)
PREFILLED_SYRINGE | INTRAVENOUS | Status: AC
  Filled 2016-11-13: qty 10

## 2016-11-13 MED ORDER — ROPIVACAINE HCL 5 MG/ML IJ SOLN
INTRAMUSCULAR | Status: DC | PRN
  Administered 2016-11-13: 25 mL via PERINEURAL

## 2016-11-13 MED ORDER — BUPIVACAINE IN DEXTROSE 0.75-8.25 % IT SOLN
INTRATHECAL | Status: DC | PRN
  Administered 2016-11-13: 13.5 mg via INTRATHECAL

## 2016-11-13 MED ORDER — METHOCARBAMOL 500 MG PO TABS
500.0000 mg | ORAL_TABLET | Freq: Four times a day (QID) | ORAL | Status: DC | PRN
  Administered 2016-11-13 (×2): 500 mg via ORAL
  Filled 2016-11-13: qty 1

## 2016-11-13 MED ORDER — GABAPENTIN 300 MG PO CAPS
300.0000 mg | ORAL_CAPSULE | Freq: Three times a day (TID) | ORAL | Status: DC
  Administered 2016-11-13 – 2016-11-14 (×3): 300 mg via ORAL
  Filled 2016-11-13 (×3): qty 1

## 2016-11-13 MED ORDER — ACETAMINOPHEN 325 MG PO TABS
650.0000 mg | ORAL_TABLET | Freq: Four times a day (QID) | ORAL | Status: DC | PRN
Start: 2016-11-13 — End: 2016-11-14
  Administered 2016-11-13: 650 mg via ORAL
  Filled 2016-11-13: qty 2

## 2016-11-13 MED ORDER — DOCUSATE SODIUM 100 MG PO CAPS
100.0000 mg | ORAL_CAPSULE | Freq: Two times a day (BID) | ORAL | Status: DC
  Administered 2016-11-13 – 2016-11-14 (×2): 100 mg via ORAL
  Filled 2016-11-13 (×2): qty 1

## 2016-11-13 MED ORDER — BISACODYL 5 MG PO TBEC: 5.0000 mg | DELAYED_RELEASE_TABLET | Freq: Every day | ORAL | Status: DC | PRN

## 2016-11-13 MED ORDER — FENTANYL CITRATE (PF) 250 MCG/5ML IJ SOLN
INTRAMUSCULAR | Status: AC
  Filled 2016-11-13: qty 5

## 2016-11-13 MED ORDER — FENTANYL CITRATE (PF) 100 MCG/2ML IJ SOLN
25.0000 ug | INTRAMUSCULAR | Status: DC | PRN
  Administered 2016-11-13: 50 ug via INTRAVENOUS

## 2016-11-13 MED ORDER — ACETAMINOPHEN 500 MG PO TABS
1000.0000 mg | ORAL_TABLET | Freq: Four times a day (QID) | ORAL | Status: AC
  Administered 2016-11-13 – 2016-11-14 (×4): 1000 mg via ORAL
  Filled 2016-11-13 (×4): qty 2

## 2016-11-13 MED ORDER — TRAMADOL HCL 50 MG PO TABS
ORAL_TABLET | ORAL | Status: AC
  Administered 2016-11-13: 100 mg via ORAL
  Filled 2016-11-13: qty 2

## 2016-11-13 MED ORDER — BUPIVACAINE HCL (PF) 0.25 % IJ SOLN
INTRAMUSCULAR | Status: AC
  Filled 2016-11-13: qty 30

## 2016-11-13 MED ORDER — ONDANSETRON HCL 4 MG/2ML IJ SOLN
INTRAMUSCULAR | Status: AC
  Filled 2016-11-13: qty 2

## 2016-11-13 MED ORDER — PHENOL 1.4 % MT LIQD: 1.0000 | OROMUCOSAL | Status: DC | PRN

## 2016-11-13 MED ORDER — BUPIVACAINE-EPINEPHRINE (PF) 0.25% -1:200000 IJ SOLN
INTRAMUSCULAR | Status: DC | PRN
  Administered 2016-11-13: 20 mL via PERINEURAL

## 2016-11-13 MED ORDER — MENTHOL 3 MG MT LOZG: 1.0000 | LOZENGE | OROMUCOSAL | Status: DC | PRN

## 2016-11-13 MED ORDER — TRAMADOL HCL 50 MG PO TABS
50.0000 mg | ORAL_TABLET | Freq: Four times a day (QID) | ORAL | Status: DC
  Administered 2016-11-13: 100 mg via ORAL

## 2016-11-13 MED ORDER — PROPOFOL 500 MG/50ML IV EMUL
INTRAVENOUS | Status: DC | PRN
  Administered 2016-11-13: 75 ug/kg/min via INTRAVENOUS

## 2016-11-13 MED ORDER — ONDANSETRON HCL 4 MG/2ML IJ SOLN: 4.0000 mg | Freq: Four times a day (QID) | INTRAMUSCULAR | Status: DC | PRN

## 2016-11-13 MED ORDER — ONDANSETRON HCL 4 MG/2ML IJ SOLN
INTRAMUSCULAR | Status: DC | PRN
  Administered 2016-11-13: 4 mg via INTRAVENOUS

## 2016-11-13 MED ORDER — DEXAMETHASONE SODIUM PHOSPHATE 10 MG/ML IJ SOLN
10.0000 mg | Freq: Once | INTRAMUSCULAR | Status: AC
  Administered 2016-11-14: 10 mg via INTRAVENOUS
  Filled 2016-11-13: qty 1

## 2016-11-13 MED ORDER — FLEET ENEMA 7-19 GM/118ML RE ENEM: 1.0000 | ENEMA | Freq: Once | RECTAL | Status: DC | PRN

## 2016-11-13 MED ORDER — ASPIRIN EC 325 MG PO TBEC
325.0000 mg | DELAYED_RELEASE_TABLET | Freq: Two times a day (BID) | ORAL | Status: DC
  Administered 2016-11-13 – 2016-11-14 (×2): 325 mg via ORAL
  Filled 2016-11-13 (×2): qty 1

## 2016-11-13 MED ORDER — PROPOFOL 10 MG/ML IV BOLUS
INTRAVENOUS | Status: DC | PRN
  Administered 2016-11-13: 20 mg via INTRAVENOUS

## 2016-11-13 MED ORDER — ALUM & MAG HYDROXIDE-SIMETH 200-200-20 MG/5ML PO SUSP: 30.0000 mL | ORAL | Status: DC | PRN

## 2016-11-13 MED ORDER — FENTANYL CITRATE (PF) 100 MCG/2ML IJ SOLN
INTRAMUSCULAR | Status: AC
  Administered 2016-11-13: 50 ug via INTRAVENOUS
  Filled 2016-11-13: qty 2

## 2016-11-13 MED ORDER — METHOCARBAMOL 500 MG PO TABS
ORAL_TABLET | ORAL | Status: AC
  Administered 2016-11-13: 500 mg via ORAL
  Filled 2016-11-13: qty 1

## 2016-11-13 MED ORDER — FENTANYL CITRATE (PF) 100 MCG/2ML IJ SOLN
INTRAMUSCULAR | Status: AC
  Administered 2016-11-13: 100 ug
  Filled 2016-11-13: qty 2

## 2016-11-13 MED ORDER — SERTRALINE HCL 100 MG PO TABS
100.0000 mg | ORAL_TABLET | Freq: Every day | ORAL | Status: DC
  Filled 2016-11-13: qty 1

## 2016-11-13 MED ORDER — ALBUTEROL SULFATE (2.5 MG/3ML) 0.083% IN NEBU: 3.0000 mL | INHALATION_SOLUTION | RESPIRATORY_TRACT | Status: DC | PRN

## 2016-11-13 MED ORDER — CLINDAMYCIN PHOSPHATE 600 MG/50ML IV SOLN
600.0000 mg | Freq: Four times a day (QID) | INTRAVENOUS | Status: AC
  Administered 2016-11-13 (×2): 600 mg via INTRAVENOUS
  Filled 2016-11-13 (×2): qty 50

## 2016-11-13 MED ORDER — METOCLOPRAMIDE HCL 5 MG PO TABS: 5.0000 mg | ORAL_TABLET | Freq: Three times a day (TID) | ORAL | Status: DC | PRN

## 2016-11-13 MED ORDER — METOCLOPRAMIDE HCL 5 MG/ML IJ SOLN: 5.0000 mg | Freq: Three times a day (TID) | INTRAMUSCULAR | Status: DC | PRN

## 2016-11-13 MED ORDER — SENNOSIDES-DOCUSATE SODIUM 8.6-50 MG PO TABS: 1.0000 | ORAL_TABLET | Freq: Every evening | ORAL | Status: DC | PRN

## 2016-11-13 MED ORDER — ONDANSETRON HCL 4 MG PO TABS: 4.0000 mg | ORAL_TABLET | Freq: Four times a day (QID) | ORAL | Status: DC | PRN

## 2016-11-13 MED ORDER — SODIUM CHLORIDE 0.9 % IR SOLN
Status: DC | PRN
  Administered 2016-11-13: 3000 mL

## 2016-11-13 MED ORDER — MIDAZOLAM HCL 2 MG/2ML IJ SOLN
INTRAMUSCULAR | Status: AC
  Administered 2016-11-13: 1 mg
  Filled 2016-11-13: qty 2

## 2016-11-13 MED ORDER — LACTATED RINGERS IV SOLN
INTRAVENOUS | Status: DC
  Administered 2016-11-13 (×2): via INTRAVENOUS

## 2016-11-13 MED ORDER — EPINEPHRINE PF 1 MG/ML IJ SOLN
INTRAMUSCULAR | Status: AC
  Filled 2016-11-13: qty 1

## 2016-11-13 MED ORDER — MOMETASONE FURO-FORMOTEROL FUM 100-5 MCG/ACT IN AERO: 2.0000 | INHALATION_SPRAY | Freq: Two times a day (BID) | RESPIRATORY_TRACT | Status: DC

## 2016-11-13 MED ORDER — ACETAMINOPHEN 650 MG RE SUPP: 650.0000 mg | Freq: Four times a day (QID) | RECTAL | Status: DC | PRN

## 2016-11-13 MED ORDER — DIPHENHYDRAMINE HCL 12.5 MG/5ML PO ELIX: 12.5000 mg | ORAL_SOLUTION | ORAL | Status: DC | PRN

## 2016-11-13 MED ORDER — TRAMADOL HCL 50 MG PO TABS
50.0000 mg | ORAL_TABLET | Freq: Four times a day (QID) | ORAL | Status: DC
  Administered 2016-11-13 – 2016-11-14 (×4): 100 mg via ORAL
  Filled 2016-11-13 (×4): qty 2

## 2016-11-13 MED ORDER — SODIUM CHLORIDE 0.9 % IV SOLN
1000.0000 mg | Freq: Once | INTRAVENOUS | Status: AC
  Administered 2016-11-13: 1000 mg via INTRAVENOUS
  Filled 2016-11-13: qty 10

## 2016-11-13 SURGICAL SUPPLY — 70 items
BANDAGE ACE 6X5 VEL STRL LF (GAUZE/BANDAGES/DRESSINGS) ×3 IMPLANT
BANDAGE ESMARK 6X9 LF (GAUZE/BANDAGES/DRESSINGS) ×1 IMPLANT
BEARING TIBIAL IBALANCE SZ3 8 (Knees) ×1 IMPLANT
BLADE PACK OXFORD PARTIAL (BLADE) ×3 IMPLANT
BLADE SAW RECIP 87.9 MT (BLADE) IMPLANT
BLADE SAW SGTL 13X75X1.27 (BLADE) IMPLANT
BLADE SAW SGTL 83.5X18.5 (BLADE) IMPLANT
BNDG ELASTIC 6X10 VLCR STRL LF (GAUZE/BANDAGES/DRESSINGS) ×3 IMPLANT
BNDG ESMARK 6X9 LF (GAUZE/BANDAGES/DRESSINGS) ×3
BOWL SMART MIX CTS (DISPOSABLE) ×3 IMPLANT
CEMENT BONE SIMPLEX SPEEDSET (Cement) ×3 IMPLANT
CLOSURE WOUND 1/2 X4 (GAUZE/BANDAGES/DRESSINGS) ×1
COMP FEM CEMENT IBALANCE SZ3 (Knees) ×3 IMPLANT
COMPONENT FEM CMNT IBALNCE SZ3 (Knees) ×1 IMPLANT
COVER SURGICAL LIGHT HANDLE (MISCELLANEOUS) ×6 IMPLANT
CUFF TOURNIQUET SINGLE 34IN LL (TOURNIQUET CUFF) ×3 IMPLANT
DRAPE EXTREMITY T 121X128X90 (DRAPE) ×3 IMPLANT
DRAPE HALF SHEET 40X57 (DRAPES) ×3 IMPLANT
DRAPE IMP U-DRAPE 54X76 (DRAPES) ×3 IMPLANT
DRAPE INCISE IOBAN 66X45 STRL (DRAPES) ×6 IMPLANT
DRAPE U-SHAPE 47X51 STRL (DRAPES) ×3 IMPLANT
DRESSING AQUACEL AG SP 3.5X10 (GAUZE/BANDAGES/DRESSINGS) ×1 IMPLANT
DRSG AQUACEL AG SP 3.5X10 (GAUZE/BANDAGES/DRESSINGS) ×3
DURAPREP 26ML APPLICATOR (WOUND CARE) ×6 IMPLANT
ELECT REM PT RETURN 9FT ADLT (ELECTROSURGICAL) ×3
ELECTRODE REM PT RTRN 9FT ADLT (ELECTROSURGICAL) ×1 IMPLANT
FLUID NSS /IRRIG 3000 ML XXX (IV SOLUTION) ×3 IMPLANT
GLOVE BIO SURGEON STRL SZ7 (GLOVE) ×3 IMPLANT
GLOVE BIO SURGEON STRL SZ7.5 (GLOVE) ×3 IMPLANT
GLOVE BIOGEL M 7.0 STRL (GLOVE) IMPLANT
GLOVE BIOGEL PI IND STRL 7.0 (GLOVE) ×1 IMPLANT
GLOVE BIOGEL PI IND STRL 7.5 (GLOVE) IMPLANT
GLOVE BIOGEL PI IND STRL 8.5 (GLOVE) ×2 IMPLANT
GLOVE BIOGEL PI INDICATOR 7.0 (GLOVE) ×2
GLOVE BIOGEL PI INDICATOR 7.5 (GLOVE)
GLOVE BIOGEL PI INDICATOR 8.5 (GLOVE) ×4
GLOVE SURG ORTHO 8.0 STRL STRW (GLOVE) ×6 IMPLANT
GLOVE SURG SS PI 7.5 STRL IVOR (GLOVE) ×3 IMPLANT
GOWN STRL REUS W/ TWL LRG LVL3 (GOWN DISPOSABLE) ×1 IMPLANT
GOWN STRL REUS W/ TWL XL LVL3 (GOWN DISPOSABLE) ×2 IMPLANT
GOWN STRL REUS W/TWL 2XL LVL3 (GOWN DISPOSABLE) ×3 IMPLANT
GOWN STRL REUS W/TWL LRG LVL3 (GOWN DISPOSABLE) ×2
GOWN STRL REUS W/TWL XL LVL3 (GOWN DISPOSABLE) ×4
HANDPIECE INTERPULSE COAX TIP (DISPOSABLE) ×2
HOOD PEEL AWAY FACE SHEILD DIS (HOOD) ×9 IMPLANT
KIT BASIN OR (CUSTOM PROCEDURE TRAY) ×3 IMPLANT
KIT ROOM TURNOVER OR (KITS) ×3 IMPLANT
MANIFOLD NEPTUNE II (INSTRUMENTS) ×3 IMPLANT
NEEDLE 21X1 OR PACK (NEEDLE) ×3 IMPLANT
NEEDLE HYPO 21X1 ECLIPSE (NEEDLE) ×3 IMPLANT
NS IRRIG 1000ML POUR BTL (IV SOLUTION) ×3 IMPLANT
PACK TOTAL JOINT (CUSTOM PROCEDURE TRAY) ×3 IMPLANT
PAD ARMBOARD 7.5X6 YLW CONV (MISCELLANEOUS) ×6 IMPLANT
SET HNDPC FAN SPRY TIP SCT (DISPOSABLE) ×1 IMPLANT
STRIP CLOSURE SKIN 1/2X4 (GAUZE/BANDAGES/DRESSINGS) ×2 IMPLANT
SUCTION FRAZIER HANDLE 10FR (MISCELLANEOUS) ×2
SUCTION TUBE FRAZIER 10FR DISP (MISCELLANEOUS) ×1 IMPLANT
SUT MNCRL AB 3-0 PS2 18 (SUTURE) ×3 IMPLANT
SUT VIC AB 0 CT1 27 (SUTURE) ×2
SUT VIC AB 0 CT1 27XBRD ANBCTR (SUTURE) ×1 IMPLANT
SUT VIC AB 1 CT1 27 (SUTURE) ×2
SUT VIC AB 1 CT1 27XBRD ANBCTR (SUTURE) ×1 IMPLANT
SUT VIC AB 2-0 CT1 27 (SUTURE) ×2
SUT VIC AB 2-0 CT1 TAPERPNT 27 (SUTURE) ×1 IMPLANT
SYR 20CC LL (SYRINGE) ×6 IMPLANT
TIBIAL BEARING IBALANCE SZ3 8 (Knees) ×3 IMPLANT
TOWEL OR 17X24 6PK STRL BLUE (TOWEL DISPOSABLE) ×3 IMPLANT
TOWEL OR 17X26 10 PK STRL BLUE (TOWEL DISPOSABLE) ×3 IMPLANT
TRAY TIB CEMENT IBALANCE SZ3 (Insert) ×3 IMPLANT
WRAP KNEE MAXI GEL POST OP (GAUZE/BANDAGES/DRESSINGS) ×3 IMPLANT

## 2016-11-13 NOTE — Care Management Note (Signed)
Case Management Note  Patient Details  Name: Valerie Patterson MRN: [id number] Date of Birth: [birth year]  Subjective/Objective:                 Spoke with patient and fanmily at the bedside. Patient in obs for partial knee, states she has CPM, BSC, RW at home, and that her OP PT is scheduled for Friday.   Action/Plan:  CM will continue to follow.  Expected Discharge Date:                  Expected Discharge Plan:  Home/Self Care  In-House Referral:     Discharge planning Services  CM Consult  Post Acute Care Choice:    Choice offered to:     DME Arranged:    DME Agency:     HH Arranged:    HH Agency:     Status of Service:  In process, will continue to follow  If discussed at Long Length of Stay Meetings, dates discussed:    Additional Comments:  Carles Collet, RN 11/13/2016, 3:15 PM

## 2016-11-13 NOTE — Anesthesia Procedure Notes (Signed)
Spinal  Patient location during procedure: OR Start time: 11/13/2016 10:12 AM End time: 11/13/2016 10:16 AM Staffing Anesthesiologist: Roderic Palau Performed: anesthesiologist  Preanesthetic Checklist Completed: patient identified, surgical consent, pre-op evaluation, timeout performed, IV checked, risks and benefits discussed and monitors and equipment checked Spinal Block Patient position: sitting Prep: DuraPrep Patient monitoring: cardiac monitor, continuous pulse ox and blood pressure Approach: midline Location: L3-4 Injection technique: single-shot Needle Needle type: Pencan  Needle gauge: 24 G Needle length: 9 cm Assessment Sensory level: T8 Additional Notes Functioning IV was confirmed and monitors were applied. Sterile prep and drape, including hand hygiene and sterile gloves were used. The patient was positioned and the spine was prepped. The skin was anesthetized with lidocaine.  Free flow of clear CSF was obtained prior to injecting local anesthetic into the CSF.  The spinal needle aspirated freely following injection.  The needle was carefully withdrawn.  The patient tolerated the procedure well.

## 2016-11-13 NOTE — Anesthesia Procedure Notes (Signed)
Anesthesia Regional Block: Adductor canal block   Pre-Anesthetic Checklist: ,, timeout performed, Correct Patient, Correct Site, Correct Laterality, Correct Procedure, Correct Position, site marked, Risks and benefits discussed, pre-op evaluation,  At surgeon's request and post-op pain management  Laterality: Left  Prep: Maximum Sterile Barrier Precautions used, chloraprep       Needles:  Injection technique: Single-shot  Needle Type: Echogenic Stimulator Needle     Needle Length: 9cm  Needle Gauge: 21     Additional Needles:   Procedures: ultrasound guided,,,,,,,,  Narrative:  Start time: 11/13/2016 8:35 AM End time: 11/13/2016 8:45 AM Injection made incrementally with aspirations every 5 mL. Anesthesiologist: Roderic Palau  Additional Notes: 2% Lidocaine skin wheel.

## 2016-11-13 NOTE — Progress Notes (Signed)
Orthopedic Tech Progress Note Patient Details:  DELAILAH SPIETH Apr 01, 1951 [id number]  CPM Left Knee CPM Left Knee: On Left Knee Flexion (Degrees): 90 Left Knee Extension (Degrees): 0 Additional Comments: applied cpm to pt left knee/leg at 0-90 degree. provided bone foam zero degree at bedside.  pt tolerated well.   Ortho Devices Type of Ortho Device: Other (comment) Ortho Device/Splint Location: applied cpm to pt left knee/leg at 0-90 degree. provided bone foam zero degree at bedside.  pt tolerated well.    Kristopher Oppenheim 11/13/2016, 12:37 PM

## 2016-11-13 NOTE — Anesthesia Preprocedure Evaluation (Addendum)
Anesthesia Evaluation  Patient identified by MRN, date of birth, ID band Patient awake    Reviewed: Allergy & Precautions, H&P , NPO status , Patient's Chart, lab work & pertinent test results  Airway Mallampati: III  TM Distance: >3 FB Neck ROM: Full    Dental no notable dental hx. (+) Teeth Intact   Pulmonary asthma ,    Pulmonary exam normal breath sounds clear to auscultation       Cardiovascular negative cardio ROS   Rhythm:Regular Rate:Normal     Neuro/Psych Depression negative neurological ROS     GI/Hepatic Neg liver ROS, GERD  Medicated and Controlled,  Endo/Other  negative endocrine ROS  Renal/GU negative Renal ROS  negative genitourinary   Musculoskeletal  (+) Arthritis , Osteoarthritis,    Abdominal   Peds  Hematology negative hematology ROS (+)   Anesthesia Other Findings   Reproductive/Obstetrics negative OB ROS                            Anesthesia Physical Anesthesia Plan  ASA: II  Anesthesia Plan: Spinal   Post-op Pain Management:  Regional for Post-op pain   Induction: Intravenous  Airway Management Planned: Simple Face Mask  Additional Equipment:   Intra-op Plan:   Post-operative Plan:   Informed Consent: I have reviewed the patients History and Physical, chart, labs and discussed the procedure including the risks, benefits and alternatives for the proposed anesthesia with the patient or authorized representative who has indicated his/her understanding and acceptance.   Dental advisory given  Plan Discussed with: CRNA  Anesthesia Plan Comments:         Anesthesia Quick Evaluation

## 2016-11-13 NOTE — Anesthesia Procedure Notes (Signed)
Procedure Name: MAC Date/Time: 11/13/2016 10:14 AM Performed by: Roderic Palau Pre-anesthesia Checklist: Emergency Drugs available, Patient identified, Suction available and Patient being monitored Patient Re-evaluated:Patient Re-evaluated prior to inductionOxygen Delivery Method: Simple face mask

## 2016-11-13 NOTE — Care Management Obs Status (Signed)
[place] NOTIFICATION   Patient Details  Name: Valerie Patterson MRN: [id number] Date of Birth: 1950/08/02   Medicare Observation Status Notification Given:  Yes    Carles Collet, RN 11/13/2016, 5:07 PM

## 2016-11-13 NOTE — Evaluation (Addendum)
Physical Therapy Evaluation Patient Details Name: Valerie Patterson MRN: [id number] DOB: February 20, 1951 Today's Date: 11/13/2016   History of Present Illness  Pt s/p lt unicompartmental knee replacement. PMH - rt THR, arthritis  Clinical Impression  Pt admitted with above diagnosis and presents to PT with functional limitations due to deficits listed below (See PT problem list). Pt needs skilled PT to maximize independence and safety to allow discharge to home with husband. Pt moving well but incontinent of urine multiple times during session. Will see again in AM to continue to mobilize and practice stairs.     Follow Up Recommendations Outpatient PT    Equipment Recommendations  None recommended by PT    Recommendations for Other Services       Precautions / Restrictions Precautions Precautions: Knee;Fall Restrictions Weight Bearing Restrictions: Yes LLE Weight Bearing: Weight bearing as tolerated      Mobility  Bed Mobility Overal bed mobility: Modified Independent             General bed mobility comments: Incr time  Transfers Overall transfer level: Needs assistance Equipment used: Rolling walker (2 wheeled) Transfers: Sit to/from [facility] Sit to Stand: Supervision Stand pivot transfers: Supervision       General transfer comment: verbal cues for hand placement  Ambulation/Gait Ambulation/Gait assistance: Min guard Ambulation Distance (Feet): 150 Feet Assistive device: Rolling walker (2 wheeled) Gait Pattern/deviations: Step-through pattern;Decreased stance time - left Gait velocity: decr Gait velocity interpretation: Below normal speed for age/gender General Gait Details: Pt slightly unsteady due to feeling of lt knee giving way at times  Stairs            Wheelchair Mobility    Modified Rankin (Stroke Patients Only)       Balance Overall balance assessment: No apparent balance deficits (not formally assessed)                                            Pertinent Vitals/Pain Pain Assessment: 0-10 Pain Score: 5  Pain Location: lt knee Pain Descriptors / Indicators: Throbbing Pain Intervention(s): Limited activity within patient's tolerance;Monitored during session;Repositioned;Premedicated before session    [place] expects to be discharged to:: Private residence Living Arrangements: Spouse/significant other Available Help at Discharge: Family;Available 24 hours/day Type of Home: House Home Access: Level entry     Home Layout: Two level;Full bath on main level Home Equipment: Walker - 2 wheels;Bedside commode      Prior Function Level of Independence: Independent               Hand Dominance        Extremity/Trunk Assessment   Upper Extremity Assessment Upper Extremity Assessment: Defer to OT evaluation    Lower Extremity Assessment Lower Extremity Assessment: LLE deficits/detail LLE Deficits / Details: Hip and ankle WFL. Fair quad set. Needs min assist for straight leg raise. LLE Sensation: decreased light touch (spinal anesthesia)       Communication   Communication: No difficulties  Cognition Arousal/Alertness: Awake/alert Behavior During Therapy: WFL for tasks assessed/performed Overall Cognitive Status: Within Functional Limits for tasks assessed                                        General Comments      Exercises Total  Joint Exercises Ankle Circles/Pumps: AROM;Both;5 reps Quad Sets: Strengthening;Left;5 reps;Seated Straight Leg Raises: AAROM;Left;5 reps;Seated Long Arc Quad: AROM;Left;5 reps;Seated Knee Flexion: AROM;Left;5 reps;Seated Goniometric ROM: 0-80   Assessment/Plan    PT Assessment Patient needs continued PT services  PT Problem List Decreased strength;Decreased range of motion;Decreased mobility;Decreased knowledge of use of DME;Pain       PT Treatment Interventions DME instruction;Gait  training;Stair training;Functional mobility training;Therapeutic activities;Therapeutic exercise;Patient/family education    PT Goals (Current goals can be found in the Care Plan section)  Acute Rehab PT Goals Patient Stated Goal: return home PT Goal Formulation: With patient Time For Goal Achievement: 11/15/16 Potential to Achieve Goals: Good    Frequency 7X/week   Barriers to discharge        Co-evaluation               AM-PAC PT "6 Clicks" Daily Activity  Outcome Measure Difficulty turning over in bed (including adjusting bedclothes, sheets and blankets)?: None Difficulty moving from lying on back to sitting on the side of the bed? : None Difficulty sitting down on and standing up from a chair with arms (e.g., wheelchair, bedside commode, etc,.)?: None Help needed moving to and from a bed to chair (including a wheelchair)?: A Little Help needed walking in hospital room?: A Little Help needed climbing 3-5 steps with a railing? : A Little 6 Click Score: 21    End of Session   Activity Tolerance: Patient tolerated treatment well Patient left: in chair;with call bell/phone within reach;with family/visitor present Nurse Communication: Mobility status PT Visit Diagnosis: Difficulty in walking, not elsewhere classified (R26.2);Pain Pain - Right/Left: Left Pain - part of body: Knee    Time: 5631-4970 PT Time Calculation (min) (ACUTE ONLY): 40 min   Charges:   PT Evaluation $PT Eval Moderate Complexity: 1 Procedure PT Treatments $Gait Training: 8-22 mins   PT G Codes:   PT G-Codes **NOT FOR INPATIENT CLASS** Functional Assessment Tool Used: AM-PAC 6 Clicks Basic Mobility Functional Limitation: Mobility: Walking and moving around Mobility: Walking and Moving Around Current Status (Y6378): At least 20 percent but less than 40 percent impaired, limited or restricted Mobility: Walking and Moving Around Goal Status [phone]): At least 1 percent but less than 20 percent  impaired, limited or restricted   [facility] PT [place] 11/13/2016, 4:17 PM

## 2016-11-13 NOTE — Transfer of Care (Signed)
Immediate Anesthesia Transfer of Care Note  Patient: Valerie Patterson  Procedure(s) Performed: Procedure(s): UNICOMPARTMENTAL KNEE (Left)  Patient Location: PACU  Anesthesia Type:MAC and Spinal  Level of Consciousness: awake, alert  and oriented  Airway & Oxygen Therapy: Patient Spontanous Breathing  Post-op Assessment: Report given to RN  Post vital signs: Reviewed and stable  Last Vitals:  Vitals:   11/13/16 0728  BP: (!) 139/53  Pulse: 66  Resp: 20  Temp: 36.6 C    Last Pain:  Vitals:   11/13/16 0728  TempSrc: Oral         Complications: No apparent anesthesia complications

## 2016-11-13 NOTE — Anesthesia Postprocedure Evaluation (Signed)
Anesthesia Post Note  Patient: Valerie Patterson  Procedure(s) Performed: Procedure(s) (LRB): UNICOMPARTMENTAL KNEE (Left)  Patient location during evaluation: PACU Anesthesia Type: Spinal and Regional Level of consciousness: awake and alert Pain management: pain level controlled Vital Signs Assessment: post-procedure vital signs reviewed and stable Respiratory status: spontaneous breathing and respiratory function stable Cardiovascular status: blood pressure returned to baseline and stable Postop Assessment: spinal receding Anesthetic complications: no       Last Vitals:  Vitals:   11/13/16 1400 11/13/16 1500  BP: 122/62 (!) 124/55  Pulse: 64 61  Resp: 14 15  Temp:  36.4 C    Last Pain:  Vitals:   11/13/16 1500  TempSrc: Oral  PainSc: 7                  Bucky Grigg,W. EDMOND

## 2016-11-14 DIAGNOSIS — M1712 Unilateral primary osteoarthritis, left knee: Secondary | ICD-10-CM | POA: Diagnosis not present

## 2016-11-14 MED ORDER — METHOCARBAMOL 500 MG PO TABS: 500.0000 mg | ORAL_TABLET | Freq: Four times a day (QID) | ORAL | 0 refills | Status: DC | PRN

## 2016-11-14 MED ORDER — ASPIRIN 325 MG PO TBEC: 325.0000 mg | DELAYED_RELEASE_TABLET | Freq: Two times a day (BID) | ORAL | 0 refills | Status: DC

## 2016-11-14 MED ORDER — TRAMADOL HCL 50 MG PO TABS: 50.0000 mg | ORAL_TABLET | Freq: Four times a day (QID) | ORAL | 0 refills | Status: DC

## 2016-11-14 NOTE — Progress Notes (Signed)
Physical Therapy Treatment Patient Details Name: Valerie Patterson MRN: [id number] DOB: 30-Jun-1951 Today's Date: 11/14/2016    History of Present Illness Pt s/p lt unicompartmental knee replacement. PMH - rt THR, arthritis    PT Comments    Pt doing well with mobility and no further PT needed.  Ready for dc from PT standpoint.     Follow Up Recommendations  Outpatient PT     Equipment Recommendations  None recommended by PT    Recommendations for Other Services       Precautions / Restrictions Precautions Precautions: Knee Precaution Comments: Reviewed knee resting in extension Restrictions Weight Bearing Restrictions: Yes LLE Weight Bearing: Weight bearing as tolerated    Mobility  Bed Mobility Overal bed mobility: Modified Independent             General bed mobility comments: Incr time  Transfers Overall transfer level: Needs assistance Equipment used: Rolling walker (2 wheeled) Transfers: Sit to/from Stand Sit to Stand: Modified independent (Device/Increase time) Stand pivot transfers: Supervision       General transfer comment: verbal cues for hand placement  Ambulation/Gait Ambulation/Gait assistance: Modified independent (Device/Increase time) Ambulation Distance (Feet): 150 Feet Assistive device: Rolling walker (2 wheeled) Gait Pattern/deviations: Step-through pattern;Decreased stance time - left;Antalgic Gait velocity: decr Gait velocity interpretation: Below normal speed for age/gender General Gait Details: Steady gait with walker. Good lt knee stability   Stairs            Wheelchair Mobility    Modified Rankin (Stroke Patients Only)       Balance Overall balance assessment: No apparent balance deficits (not formally assessed)                                          Cognition Arousal/Alertness: Awake/alert Behavior During Therapy: WFL for tasks assessed/performed Overall Cognitive Status: Within Functional  Limits for tasks assessed                                        Exercises Total Joint Exercises Short Arc Quad: Left;5 reps;Supine;AROM Heel Slides: AAROM;5 reps;Left;Supine Hip ABduction/ADduction: AAROM;5 reps;Left;Supine Straight Leg Raises: AAROM;Left;5 reps;Supine Long Arc Quad: AROM;Left;5 reps;Seated Knee Flexion: AROM;Left;5 reps;Seated Goniometric ROM: 5-93    General Comments General comments (skin integrity, edema, etc.): Pt husband present for part of session. Provided all needed education for ADLs       Pertinent Vitals/Pain Pain Assessment: Faces Faces Pain Scale: Hurts little more Pain Location: lt knee Pain Descriptors / Indicators: Throbbing Pain Intervention(s): Monitored during session;Limited activity within patient's tolerance    Home Living Family/patient expects to be discharged to:: Private residence Living Arrangements: Spouse/significant other Available Help at Discharge: Family;Available 24 hours/day Type of Home: House Home Access: Level entry   Home Layout: Two level;Full bath on main level Home Equipment: Walker - 2 wheels;Bedside commode      Prior Function Level of Independence: Independent          PT Goals (current goals can now be found in the care plan section) Acute Rehab PT Goals Patient Stated Goal: return home Progress towards PT goals: Goals met/education completed, patient discharged from PT    Frequency    7X/week      PT Plan Current plan remains appropriate    Co-evaluation  AM-PAC PT "6 Clicks" Daily Activity  Outcome Measure  Difficulty turning over in bed (including adjusting bedclothes, sheets and blankets)?: None Difficulty moving from lying on back to sitting on the side of the bed? : None Difficulty sitting down on and standing up from a chair with arms (e.g., wheelchair, bedside commode, etc,.)?: None Help needed moving to and from a bed to chair (including a  wheelchair)?: None Help needed walking in hospital room?: None Help needed climbing 3-5 steps with a railing? : A Little 6 Click Score: 23    End of Session   Activity Tolerance: Patient tolerated treatment well Patient left: in chair;with call bell/phone within reach;with family/visitor present   PT Visit Diagnosis: Difficulty in walking, not elsewhere classified (R26.2);Pain Pain - Right/Left: Left Pain - part of body: Knee     Time: 2761-8485 PT Time Calculation (min) (ACUTE ONLY): 20 min  Charges:  $Gait Training: 8-22 mins                    G Codes:       [facility] PT [place] 11/14/2016, 10:28 AM

## 2016-11-14 NOTE — Progress Notes (Signed)
SPORTS MEDICINE AND JOINT REPLACEMENT  Lara Mulch, MD    Carlyon Shadow, PA-C [address], [place], [place]  [id number]                             [phone]   PROGRESS NOTE  Subjective:  negative for Chest Pain  negative for Shortness of Breath  negative for Nausea/Vomiting   negative for Calf Pain  negative for Bowel Movement   Tolerating Diet: yes         Patient reports pain as 3 on 0-10 scale.    Objective: Vital signs in last 24 hours:   Patient Vitals for the past 24 hrs:  BP Temp Temp src Pulse Resp SpO2  11/14/16 0500 128/68 97.6 F (36.4 C) Oral 70 15 96 %  11/14/16 0110 120/65 97.2 F (36.2 C) Oral 68 15 96 %  11/13/16 1950 123/75 97.6 F (36.4 C) Oral 68 15 96 %  11/13/16 1500 (!) 124/55 97.6 F (36.4 C) Oral 61 15 96 %  11/13/16 1400 122/62 - - 64 14 96 %  11/13/16 1345 113/60 - - 60 11 92 %  11/13/16 1330 (!) 110/56 - - 66 10 91 %  11/13/16 1315 108/60 - - 60 10 92 %  11/13/16 1300 (!) 106/58 - - 64 10 91 %  11/13/16 1245 (!) 107/59 - - 62 12 92 %  11/13/16 1230 101/61 - - 61 15 94 %  11/13/16 1215 (!) 112/59 - - 63 10 93 %  11/13/16 1201 (!) 116/59 97.4 F (36.3 C) - 64 10 95 %  11/13/16 0728 (!) 139/53 97.8 F (36.6 C) Oral 66 20 97 %    @flow {1959:LAST@   Intake/Output from previous day:   04/30 0701 - 05/01 0700 In: 1740 [P.O.:240; I.V.:1500] Out: 650 [Urine:600]   Intake/Output this shift:   No intake/output data recorded.   Intake/Output      04/30 0701 - 05/01 0700 05/01 0701 - 05/02 0700   P.O. 240    I.V. 1500    Total Intake 1740     Urine 600    Blood 50    Total Output 650     Net +1090             LABORATORY DATA: No results for input(s): WBC, HGB, HCT, PLT in the last 168 hours. No results for input(s): NA, K, CL, CO2, BUN, CREATININE, GLUCOSE, CALCIUM in the last 168 hours. Lab Results  Component Value Date   INR 1.04 12/02/2014    Examination:  General appearance: alert, cooperative and no  distress Extremities: extremities normal, atraumatic, no cyanosis or edema  Wound Exam: clean, dry, intact   Drainage:  None: wound tissue dry  Motor Exam: Quadriceps and Hamstrings Intact  Sensory Exam: Superficial Peroneal, Deep Peroneal and Tibial normal   Assessment:    1 Day Post-Op  Procedure(s) (LRB): UNICOMPARTMENTAL KNEE (Left)  ADDITIONAL DIAGNOSIS:  Active Problems:   S/P left unicompartmental knee replacement     Plan: Physical Therapy as ordered Weight Bearing as Tolerated (WBAT)  DVT Prophylaxis:  Aspirin  DISCHARGE PLAN: Home  DISCHARGE NEEDS: outpatient pt   Patient doing well, will D/C home today         Donia Ast 11/14/2016, 7:06 AM

## 2016-11-14 NOTE — Care Management (Signed)
Case manager ordered shower seat for patient through [employer]. Patient informed that she will have to pay $38.00, as it is not covered by Medicare.

## 2016-11-14 NOTE — Evaluation (Signed)
Occupational Therapy Evaluation Patient Details Name: Valerie Patterson MRN: [id number] DOB: Mar 07, 1951 Today's Date: 11/14/2016    History of Present Illness Pt s/p lt unicompartmental knee replacement. PMH - rt THR, arthritis   Clinical Impression   PTA, pt was living with her husband and was independent. Currently, pt performs ADLs and functional mobility at a supervision level with increased time, use of RW, and Min VCs to "slow down" due to slight impulsivity. Educated pt on LB dressing, tub transfer with shower seat, and RW management to prevent falls. Pt verbalized understanding. Recommend dc home once medically stable per physician. Provided all education and answered pt questions. All acute OT needs met and will sign off.     Follow Up Recommendations  No OT follow up;Supervision/Assistance - 24 hour    Equipment Recommendations  Tub/shower seat    Recommendations for Other Services PT consult     Precautions / Restrictions Precautions Precautions: Knee;Fall Precaution Comments: Reviewed knee resting in extension Restrictions Weight Bearing Restrictions: Yes LLE Weight Bearing: Weight bearing as tolerated      Mobility Bed Mobility Overal bed mobility: Modified Independent             General bed mobility comments: Incr time  Transfers Overall transfer level: Needs assistance Equipment used: Rolling walker (2 wheeled) Transfers: Sit to/from [facility] Sit to Stand: Supervision Stand pivot transfers: Supervision       General transfer comment: verbal cues for hand placement    Balance Overall balance assessment: No apparent balance deficits (not formally assessed)                                         ADL either performed or assessed with clinical judgement   ADL Overall ADL's : Needs assistance/impaired (Supervision and near baseline)                                       General ADL Comments: Pt  performing ADLs including grooming, dressing, bathing, and fucntional mobility at a supervision level with increased time and use of RW. Pt benefits from VCs to take her time; she has a tendency to rush and can be slightly impulsive. (Impulsive and quick at baseline)     Vision         Perception     Praxis      Pertinent Vitals/Pain Pain Assessment: Faces Faces Pain Scale: Hurts a little bit Pain Location: L knee Pain Descriptors / Indicators: Throbbing Pain Intervention(s): Monitored during session     Hand Dominance Right   Extremity/Trunk Assessment Upper Extremity Assessment Upper Extremity Assessment: Overall WFL for tasks assessed   Lower Extremity Assessment Lower Extremity Assessment: Defer to PT evaluation LLE Sensation:   Cervical / Trunk Assessment Cervical / Trunk Assessment: Normal   Communication Communication Communication: No difficulties   Cognition Arousal/Alertness: Awake/alert Behavior During Therapy: WFL for tasks assessed/performed Overall Cognitive Status: Within Functional Limits for tasks assessed                                     General Comments  Pt husband present for part of session. Provided all needed education for ADLs     Exercises  Shoulder Instructions      Home Living Family/patient expects to be discharged to:: Private residence Living Arrangements: Spouse/significant other Available Help at Discharge: Family;Available 24 hours/day Type of Home: House Home Access: Level entry     Home Layout: Two level;Full bath on main level Alternate Level Stairs-Number of Steps: 16 Alternate Level Stairs-Rails: Left Bathroom Shower/Tub: Tub/shower unit   Bathroom Toilet: Standard Bathroom Accessibility: Yes   Home Equipment: Walker - 2 wheels;Bedside commode          Prior Functioning/Environment Level of Independence: Independent                 OT Problem List: Decreased strength;Decreased  range of motion;Decreased activity tolerance;Decreased safety awareness;Decreased knowledge of use of DME or AE;Decreased knowledge of precautions;Pain      OT Treatment/Interventions:      OT Goals(Current goals can be found in the care plan section) Acute Rehab OT Goals Patient Stated Goal: return home OT Goal Formulation: With patient Time For Goal Achievement: 11/28/16 Potential to Achieve Goals: Good  OT Frequency:     Barriers to D/C:            Co-evaluation              AM-PAC PT "6 Clicks" Daily Activity     Outcome Measure Help from another person eating meals?: None Help from another person taking care of personal grooming?: None Help from another person toileting, which includes using toliet, bedpan, or urinal?: None Help from another person bathing (including washing, rinsing, drying)?: A Little Help from another person to put on and taking off regular upper body clothing?: None Help from another person to put on and taking off regular lower body clothing?: A Little 6 Click Score: 22   End of Session Equipment Utilized During Treatment: Rolling walker Nurse Communication: Mobility status  Activity Tolerance: Patient tolerated treatment well Patient left:  (with PT in Ortho gym)  OT Visit Diagnosis: Unsteadiness on feet (R26.81);Muscle weakness (generalized) (M62.81);Pain Pain - Right/Left: Left Pain - part of body: Knee                Time: 7062-3762 OT Time Calculation (min): 35 min Charges:  OT General Charges $OT Visit: 1 Procedure OT Evaluation $OT Eval Low Complexity: 1 Procedure OT Treatments $Self Care/Home Management : 8-22 mins G-Codes: OT G-codes **NOT FOR INPATIENT CLASS** Functional Assessment Tool Used: Clinical judgement Functional Limitation: Self care Self Care Current Status (G3151): At least 1 percent but less than 20 percent impaired, limited or restricted Self Care Goal Status (V6160): At least 1 percent but less than 20 percent  impaired, limited or restricted Self Care Discharge Status [phone]): At least 1 percent but less than 20 percent impaired, limited or restricted   [employer], OTR/L [place] 11/14/2016, 9:37 AM

## 2016-11-14 NOTE — Discharge Summary (Signed)
Valerie   Lara Mulch, MD   Valerie Shadow, PA-C Valerie Patterson, [place], [place]  [address]                             [phone]  PATIENT ID: Valerie Patterson        MRN:  [id number]          DOB/AGE: [birth year] / 66 y.o.    DISCHARGE SUMMARY  ADMISSION DATE:    11/13/2016 DISCHARGE DATE:   11/14/2016   ADMISSION DIAGNOSIS: primary osteoarthritis left knee    DISCHARGE DIAGNOSIS:  primary osteoarthritis left knee    ADDITIONAL DIAGNOSIS: Active Problems:   S/P left unicompartmental knee replacement  Past Medical History:  Diagnosis Date  . Allergy    takes Zyrtec daily  . Asthma   . Bronchitis    hx of  . Coarse tremors    pt stated from inhalers  . Depression    takes Zoloft daily  . GERD (gastroesophageal reflux disease)    takes Zantac daily  . Hypercholesterolemia    not on any meds  . Joint pain   . Joint swelling   . Osteoarthritis of both hips   . Restless leg   . Urinary incontinence    minimally    PROCEDURE: Procedure(s): UNICOMPARTMENTAL KNEE on 11/13/2016  CONSULTS:    HISTORY:  See H&P in chart  HOSPITAL COURSE:  Valerie Patterson is a 66 y.o. admitted on 11/13/2016 and found to have a diagnosis of primary osteoarthritis left knee.  After appropriate laboratory studies were obtained  they were taken to the operating room on 11/13/2016 and underwent Procedure(s): UNICOMPARTMENTAL KNEE.   They were given perioperative antibiotics:  Anti-infectives    Start     Dose/Rate Route Frequency Ordered Stop   11/13/16 1600  clindamycin (CLEOCIN) IVPB 600 mg     600 mg 100 mL/hr over 30 Minutes Intravenous Every 6 hours 11/13/16 1436 11/13/16 2133   11/13/16 0900  clindamycin (CLEOCIN) IVPB 900 mg     900 mg 100 mL/hr over 30 Minutes Intravenous To ShortStay Surgical 11/10/16 0956 11/13/16 1005    .  Patient given tranexamic acid IV or topical and exparel intra-operatively.  Tolerated the procedure well.    POD# 1:  Vital signs were stable.  Patient denied Chest pain, shortness of breath, or calf pain.  Patient was started on Lovenox 30 mg subcutaneously twice daily at 8am.  Consults to PT, OT, and care management were made.  The patient was weight bearing as tolerated.  CPM was placed on the operative leg 0-90 degrees for 6-8 hours a day. When out of the CPM, patient was placed in the foam block to achieve full extension. Incentive spirometry was taught.  Dressing was changed.       POD #2, Continued  PT for ambulation and exercise program.  IV saline locked.  O2 discontinued.    The remainder of the hospital course was dedicated to ambulation and strengthening.   The patient was discharged on 1 Day Post-Op in  Good condition.  Blood products given:none  DIAGNOSTIC STUDIES: Recent vital signs: Patient Vitals for the past 24 hrs:  BP Temp Temp src Pulse Resp SpO2  11/14/16 0500 128/68 97.6 F (36.4 C) Oral 70 15 96 %  11/14/16 0110 120/65 97.2 F (36.2 C) Oral 68 15 96 %  11/13/16 1950 123/75 97.6 F (36.4  C) Oral 68 15 96 %  11/13/16 1500 (!) 124/55 97.6 F (36.4 C) Oral 61 15 96 %  11/13/16 1400 122/62 - - 64 14 96 %  11/13/16 1345 113/60 - - 60 11 92 %  11/13/16 1330 (!) 110/56 - - 66 10 91 %  11/13/16 1315 108/60 - - 60 10 92 %  11/13/16 1300 (!) 106/58 - - 64 10 91 %  11/13/16 1245 (!) 107/59 - - 62 12 92 %  11/13/16 1230 101/61 - - 61 15 94 %  11/13/16 1215 (!) 112/59 - - 63 10 93 %  11/13/16 1201 (!) 116/59 97.4 F (36.3 C) - 64 10 95 %  11/13/16 0728 (!) 139/53 97.8 F (36.6 C) Oral 66 20 97 %       Recent laboratory studies: No results for input(s): WBC, HGB, HCT, PLT in the last 168 hours. No results for input(s): NA, K, CL, CO2, BUN, CREATININE, GLUCOSE, CALCIUM in the last 168 hours. Lab Results  Component Value Date   INR 1.04 12/02/2014     Recent Radiographic Studies :  No results found.  DISCHARGE INSTRUCTIONS: Discharge Instructions    CPM    Complete by:  As  directed    Continuous passive motion machine (CPM):      Use the CPM from 0 to 90 for 4-6 hours per day.      You may increase by 10 per day.  You may break it up into 2 or 3 sessions per day.      Use CPM for 2 weeks or until you are told to stop.   Call MD / Call 911    Complete by:  As directed    If you experience chest pain or shortness of breath, CALL 911 and be transported to the hospital emergency room.  If you develope a fever above 101 F, pus (white drainage) or increased drainage or redness at the wound, or calf pain, call your surgeon's office.   Constipation Prevention    Complete by:  As directed    Drink plenty of fluids.  Prune juice may be helpful.  You may use a stool softener, such as Colace (over the counter) 100 mg twice a day.  Use MiraLax (over the counter) for constipation as needed.   Diet - low sodium heart healthy    Complete by:  As directed    Discharge instructions    Complete by:  As directed    INSTRUCTIONS AFTER JOINT REPLACEMENT   Remove items at home which could result in a fall. This includes throw rugs or furniture in walking pathways ICE to the affected joint every three hours while awake for 30 minutes at a time, for at least the first 3-5 days, and then as needed for pain and swelling.  Continue to use ice for pain and swelling. You may notice swelling that will progress down to the foot and ankle.  This is normal after surgery.  Elevate your leg when you are not up walking on it.   Continue to use the breathing machine you got in the hospital (incentive spirometer) which will help keep your temperature down.  It is common for your temperature to cycle up and down following surgery, especially at night when you are not up moving around and exerting yourself.  The breathing machine keeps your lungs expanded and your temperature down.   DIET:  As you were doing prior to hospitalization, we recommend a well-balanced  diet.  DRESSING / WOUND CARE /  SHOWERING  Keep the surgical dressing until follow up.  The dressing is water proof, so you can shower without any extra covering.  IF THE DRESSING FALLS OFF or the wound gets wet inside, change the dressing with sterile gauze.  Please use good hand washing techniques before changing the dressing.  Do not use any lotions or creams on the incision until instructed by your surgeon.    ACTIVITY  Increase activity slowly as tolerated, but follow the weight bearing instructions below.   No driving for 6 weeks or until further direction given by your physician.  You cannot drive while taking narcotics.  No lifting or carrying greater than 10 lbs. until further directed by your surgeon. Avoid periods of inactivity such as sitting longer than an hour when not asleep. This helps prevent blood clots.  You may return to work once you are authorized by your doctor.     WEIGHT BEARING   Weight bearing as tolerated with assist device (walker, cane, etc) as directed, use it as long as suggested by your surgeon or therapist, typically at least 4-6 weeks.   EXERCISES  Results after joint replacement surgery are often greatly improved when you follow the exercise, range of motion and muscle strengthening exercises prescribed by your doctor. Safety measures are also important to protect the joint from further injury. Any time any of these exercises cause you to have increased pain or swelling, decrease what you are doing until you are comfortable again and then slowly increase them. If you have problems or questions, call your caregiver or physical therapist for advice.   Rehabilitation is important following a joint replacement. After just a few days of immobilization, the muscles of the leg can become weakened and shrink (atrophy).  These exercises are designed to build up the tone and strength of the thigh and leg muscles and to improve motion. Often times heat used for twenty to thirty minutes before working  out will loosen up your tissues and help with improving the range of motion but do not use heat for the first two weeks following surgery (sometimes heat can increase post-operative swelling).   These exercises can be done on a training (exercise) mat, on the floor, on a table or on a bed. Use whatever works the best and is most comfortable for you.    Use music or television while you are exercising so that the exercises are a pleasant break in your day. This will make your life better with the exercises acting as a break in your routine that you can look forward to.   Perform all exercises about fifteen times, three times per day or as directed.  You should exercise both the operative leg and the other leg as well.   Exercises include:   Quad Sets - Tighten up the muscle on the front of the thigh (Quad) and hold for 5-10 seconds.   Straight Leg Raises - With your knee straight (if you were given a brace, keep it on), lift the leg to 60 degrees, hold for 3 seconds, and slowly lower the leg.  Perform this exercise against resistance later as your leg gets stronger.  Leg Slides: Lying on your back, slowly slide your foot toward your buttocks, bending your knee up off the floor (only go as far as is comfortable). Then slowly slide your foot back down until your leg is flat on the floor again.  Angel Wings: Lying  on your back spread your legs to the side as far apart as you can without causing discomfort.  Hamstring Strength:  Lying on your back, push your heel against the floor with your leg straight by tightening up the muscles of your buttocks.  Repeat, but this time bend your knee to a comfortable angle, and push your heel against the floor.  You may put a pillow under the heel to make it more comfortable if necessary.   A rehabilitation program following joint replacement surgery can speed recovery and prevent re-injury in the future due to weakened muscles. Contact your doctor or a physical therapist  for more information on knee rehabilitation.    CONSTIPATION  Constipation is defined medically as fewer than three stools per week and severe constipation as less than one stool per week.  Even if you have a regular bowel pattern at home, your normal regimen is likely to be disrupted due to multiple reasons following surgery.  Combination of anesthesia, postoperative narcotics, change in appetite and fluid intake all can affect your bowels.   YOU MUST use at least one of the following options; they are listed in order of increasing strength to get the job done.  They are all available over the counter, and you may need to use some, POSSIBLY even all of these options:    Drink plenty of fluids (prune juice may be helpful) and high fiber foods Colace 100 mg by mouth twice a day  Senokot for constipation as directed and as needed Dulcolax (bisacodyl), take with full glass of water  Miralax (polyethylene glycol) once or twice a day as needed.  If you have tried all these things and are unable to have a bowel movement in the first 3-4 days after surgery call either your surgeon or your primary doctor.    If you experience loose stools or diarrhea, hold the medications until you stool forms back up.  If your symptoms do not get better within 1 week or if they get worse, check with your doctor.  If you experience "the worst abdominal pain ever" or develop nausea or vomiting, please contact the office immediately for further recommendations for treatment.   ITCHING:  If you experience itching with your medications, try taking only a single pain pill, or even half a pain pill at a time.  You can also use Benadryl over the counter for itching or also to help with sleep.   TED HOSE STOCKINGS:  Use stockings on both legs until for at least 2 weeks or as directed by physician office. They may be removed at night for sleeping.  MEDICATIONS:  See your medication summary on the "After Visit Summary" that  nursing will review with you.  You may have some home medications which will be placed on hold until you complete the course of blood thinner medication.  It is important for you to complete the blood thinner medication as prescribed.  PRECAUTIONS:  If you experience chest pain or shortness of breath - call 911 immediately for transfer to the hospital emergency department.   If you develop a fever greater that 101 F, purulent drainage from wound, increased redness or drainage from wound, foul odor from the wound/dressing, or calf pain - CONTACT YOUR SURGEON.  FOLLOW-UP APPOINTMENTS:  If you do not already have a post-op appointment, please call the office for an appointment to be seen by your surgeon.  Guidelines for how soon to be seen are listed in your "After Visit Summary", but are typically between 1-4 weeks after surgery.  OTHER INSTRUCTIONS:   Knee Replacement:  Do not place pillow under knee, focus on keeping the knee straight while resting. CPM instructions: 0-90 degrees, 2 hours in the morning, 2 hours in the afternoon, and 2 hours in the evening. Place foam block, curve side up under heel at all times except when in CPM or when walking.  DO NOT modify, tear, cut, or change the foam block in any way.  MAKE SURE YOU:  Understand these instructions.  Get help right away if you are not doing well or get worse.    Thank you for letting us be a part of your medical care team.  It is a privilege we respect greatly.  We hope these instructions will help you stay on track for a fast and full recovery!   Increase activity slowly as tolerated    Complete by:  As directed       DISCHARGE MEDICATIONS:   Allergies as of 11/14/2016      Reactions   Dilaudid [hydromorphone Hcl] Anaphylaxis   Tolerated Tramadol 10/2016.  TDD.   Hydromorphone Anaphylaxis   Augmentin [amoxicillin-pot Clavulanate] Diarrhea, Nausea And Vomiting   STOMACH ACHE    Compazine [prochlorperazine] Other (See Comments)   EXTRAPYRAMIDAL MOVEMENTS DREW MOUTH TO ONE SIDE   Pravastatin Other (See Comments)   MYALGIAS   Xarelto [rivaroxaban]    BLEEDING per PATIENT REPORT "PLACED ON LOW-DOSE ASA INSTEAD"   Erythromycin Base    UNSPECIFIED REACTION    Sulfamethoxazole    UNSPECIFIED REACTION    Erythromycin Nausea And Vomiting   Sulfa Antibiotics Rash      Medication List    STOP taking these medications   meloxicam 7.5 MG tablet Commonly known as:  MOBIC   oxyCODONE 5 MG immediate release tablet Commonly known as:  Oxy IR/ROXICODONE     TAKE these medications   acetaminophen 650 MG CR tablet Commonly known as:  TYLENOL Take 650 mg by mouth every 8 (eight) hours as needed for pain.   albuterol 108 (90 Base) MCG/ACT inhaler Commonly known as:  PROAIR HFA 2 puffs every 4 hours as needed only  if your can't catch your breath What changed:  how much to take  how to take this  when to take this  reasons to take this  additional instructions   aspirin 325 MG EC tablet Take 1 tablet (325 mg total) by mouth 2 (two) times daily.   b complex vitamins tablet Take 2 tablets by mouth daily. gummies   CALCIUM+D3 600-800 MG-UNIT Tabs Generic drug:  Calcium Carb-Cholecalciferol Take 1 tablet by mouth daily.   cetirizine 10 MG tablet Commonly known as:  ZYRTEC Take 10 mg by mouth daily.   DULERA 200-5 MCG/ACT Aero Generic drug:  mometasone-formoterol Inhale 1 puff into the lungs 2 (two) times daily.   HAIR SKIN & NAILS GUMMIES PO Take 2 tablets by mouth daily.   HAIR/SKIN/NAILS PO Take by mouth.   ICAPS AREDS 2 Caps Take 2 capsules by mouth daily.   methocarbamol 500 MG tablet Commonly known as:  ROBAXIN Take 1-2 tablets (500-1,000 mg total) by mouth every 6 (six) hours as needed for muscle spasms.   NASACORT ALLERGY 24HR 55  MCG/ACT Aero nasal inhaler Generic drug:  triamcinolone Place 2 sprays into the nose 2 (two) times  daily.   ondansetron 4 MG disintegrating tablet Commonly known as:  ZOFRAN ODT Take 1 tablet (4 mg total) by mouth every 8 (eight) hours as needed for nausea or vomiting.   OVER THE COUNTER MEDICATION Take 1 tablet by mouth daily. [employer] brand of Estroblend   ranitidine 150 MG tablet Commonly known as:  ZANTAC One at bedtime What changed:  how much to take  how to take this  when to take this  additional instructions   sertraline 100 MG tablet Commonly known as:  ZOLOFT Take 100 mg by mouth daily before breakfast.   traMADol 50 MG tablet Commonly known as:  ULTRAM Take 1-2 tablets (50-100 mg total) by mouth every 6 (six) hours.            Durable Medical Equipment        Start     Ordered   11/13/16 1437  DME Walker rolling  Once    Question:  Patient needs a walker to treat with the following condition  Answer:  S/P left unicompartmental knee replacement   11/13/16 1436   11/13/16 1437  DME 3 n 1  Once     11/13/16 1436   11/13/16 1437  DME Bedside commode  Once    Question:  Patient needs a bedside commode to treat with the following condition  Answer:  S/P left unicompartmental knee replacement   11/13/16 1436      FOLLOW UP VISIT:    DISPOSITION: HOME VS. SNF  CONDITION:  Good   Donia Ast 11/14/2016, 7:11 AM

## 2016-11-14 NOTE — Op Note (Addendum)
UNI KNEE REPLACEMENT OPERATIVE NOTE:  11/13/2016  3:00 PM  PATIENT:  Valerie Patterson  66 y.o. female  PRE-OPERATIVE DIAGNOSIS:  primary osteoarthritis left knee  POST-OPERATIVE DIAGNOSIS:  primary osteoarthritis left knee  PROCEDURE:  Procedure(s): UNICOMPARTMENTAL KNEE  SURGEON:  Surgeon(s): Vickey Huger, MD  PHYSICIAN ASSISTANT: Carlyon Shadow, [facility]  ANESTHESIA:   spinal  DRAINS: Hemovac  SPECIMEN: None  COUNTS:  Correct  TOURNIQUET:   Total Tourniquet Time Documented: Thigh (Left) - 44 minutes Total: Thigh (Left) - 44 minutes   DICTATION:  Indication for procedure:    The patient is a 66 y.o. female who has failed conservative treatment for primary osteoarthritis left knee.  Informed consent was obtained prior to anesthesia. The risks versus benefits of the operation were explain and in a way the patient can, and did, understand.   On the implant demand matching protocol, this patient scored 10.  Therefore, this patient was not receive a polyethylene insert with vitamin E which is a high demand implant.  Description of procedure:     The patient was taken to the operating room and placed under anesthesia.  The patient was positioned in the usual fashion taking care that all body parts were adequately padded and/or protected.  I foley catheter was not placed.  A tourniquet was applied and the leg prepped and draped in the usual sterile fashion.  The extremity was exsanguinated with the esmarch and tourniquet inflated to 350 mmHg.  Pre-operative range of motion was normal.  The knee was in 5 degree of neutral.  A midline incision approximately 3-4 inches long was made with a #10 blade.  A new blade was used to make a parapatellar arthrotomy going 1 cm into the quadriceps tendon, over the patella, and alongside the medial aspect of the patellar tendon.  A synovectomy was then performed with the #10 blade and forceps. I then elevated the deep MCL off the medial tibial flare.  The knee was put at 90 degrees and the patient specific cutting blocks were used to make our proximal tibial cut and distal femoral cut. The medial meniscus was removed at this point.  I then used the 3 cutting guide on the femur to drill for lugs and cut the chamfers. Likewise, a 2 tibial baseplate was used to prepare the tibia. I then trialed the 3 femur and 2 tibia. I trialed several poly inserts and a 8 mm achieved good balance in flexion and extension.  I then irrigated copiously and then mixed the cement. I injected exparel in the deep soft tissues at this point. I then cemented the tibia first followed by the femur and removed excess cement and then inserted the polyethylene. I placed the leg in extension and finished injecting the rest of the exparel.  BLOOD LOSS:  300cc DRAINS: 1 hemovac, 1 pain catheter COMPLICATIONS:  None.  PLAN OF CARE: Admit for overnight observation  PATIENT DISPOSITION:  PACU - hemodynamically stable.   Delay start of Pharmacological VTE agent (>24hrs) due to surgical blood loss or risk of bleeding:  not applicable  Please fax a copy of this op note to my office at [phone] (please only include page 1 and 2 of the Case Information op note)

## 2016-11-14 NOTE — Progress Notes (Signed)
Patient to be discharged. Discharge instructions and prescriptions reviewed with patient. Patient stated understanding. No IV access. Patient has spouse at bedside for transportation

## 2016-11-15 ENCOUNTER — Encounter (HOSPITAL_COMMUNITY): Payer: Self-pay | Admitting: Orthopedic Surgery

## 2017-06-05 ENCOUNTER — Other Ambulatory Visit: Payer: Self-pay | Admitting: Physician Assistant

## 2017-06-05 DIAGNOSIS — D099 Carcinoma in situ, unspecified: Secondary | ICD-10-CM

## 2017-06-05 HISTORY — DX: Carcinoma in situ, unspecified: D09.9

## 2020-03-26 ENCOUNTER — Ambulatory Visit (INDEPENDENT_AMBULATORY_CARE_PROVIDER_SITE_OTHER): Payer: Medicare Other | Admitting: Physician Assistant

## 2020-03-26 ENCOUNTER — Other Ambulatory Visit: Payer: Self-pay

## 2020-03-26 ENCOUNTER — Encounter: Payer: Self-pay | Admitting: Physician Assistant

## 2020-03-26 DIAGNOSIS — Z85828 Personal history of other malignant neoplasm of skin: Secondary | ICD-10-CM

## 2020-03-26 DIAGNOSIS — L570 Actinic keratosis: Secondary | ICD-10-CM

## 2020-03-26 DIAGNOSIS — D485 Neoplasm of uncertain behavior of skin: Secondary | ICD-10-CM

## 2020-03-26 DIAGNOSIS — L719 Rosacea, unspecified: Secondary | ICD-10-CM

## 2020-03-26 DIAGNOSIS — B079 Viral wart, unspecified: Secondary | ICD-10-CM | POA: Diagnosis not present

## 2020-03-26 DIAGNOSIS — Z1283 Encounter for screening for malignant neoplasm of skin: Secondary | ICD-10-CM

## 2020-03-26 MED ORDER — METRONIDAZOLE 0.75 % EX CREA: TOPICAL_CREAM | Freq: Two times a day (BID) | CUTANEOUS | 3 refills | Status: DC

## 2020-03-26 MED ORDER — FLUOROURACIL 5 % EX CREA: TOPICAL_CREAM | Freq: Every day | CUTANEOUS | 1 refills | Status: DC

## 2020-03-26 NOTE — Progress Notes (Signed)
Follow-Up Visit   Subjective  Valerie Patterson is a 69 y.o. female who presents for the following: Annual Exam (skin check--concern about the face and legs) and Warts (1 near left knee and 1 right ring finger).   The following portions of the chart were reviewed this encounter and updated as appropriate: Tobacco  Allergies  Meds  Problems  Med Hx  Surg Hx  Fam Hx      Objective  Well appearing patient in no apparent distress; mood and affect are within normal limits.  A full examination was performed including scalp, head, eyes, ears, nose, lips, neck, chest, axillae, abdomen, back, buttocks, bilateral upper extremities, bilateral lower extremities, hands, feet, fingers, toes, fingernails, and toenails. All findings within normal limits unless otherwise noted below.  Objective  Chest - Medial [facility]), Left Upper Cutaneous Lip: Scar clear  Objective  Head - to toe: No atypical nevi . No signs of non-mole skin cancer.   Right Buccal Cheek      Objective  Left Lower Leg - Anterior (2), Right Lower Leg - Anterior (3), Right Palmar Middle 4th Finger: Verrucous papules -- Discussed viral etiology and contagion.   Objective  Chest - Medial [facility]): Erythematous patches with gritty scale.  Objective  Anterior (Face): Centrifacial erythema with or without papules/pustules.   Assessment & Plan  History of SCC (squamous cell carcinoma) of skin (2) Chest - Medial [facility]); Left Upper Cutaneous Lip  observe  Screening exam for skin cancer Head - to toe  Yearly skin exam.  Neoplasm of uncertain behavior of skin Right Buccal Cheek   Skin / nail biopsy Type of biopsy: punch   Informed consent: discussed and consent obtained   Timeout: patient name, date of birth, surgical site, and procedure verified   Procedure prep:  Patient was prepped and draped in usual sterile fashion (Non sterile) Prep type:  Chlorhexidine Anesthesia: the lesion was anesthetized in a  standard fashion   Anesthetic:  1% lidocaine w/ epinephrine 1-100,000 local infiltration Punch size:  3 mm Suture type: nylon   Suture removal (days):  7 Hemostasis achieved with: suture   Outcome: patient tolerated procedure well   Post-procedure details: wound care instructions given    Specimen 1 - Surgical pathology Differential Diagnosis: pore of Winer  Check Margins: No  Viral warts, unspecified type (6) Left Lower Leg - Anterior (2); Right Lower Leg - Anterior (3); Right Palmar Middle 4th Finger  Destruction of lesion - Left Lower Leg - Anterior, Right Lower Leg - Anterior, Right Palmar Middle 4th Finger Complexity: simple   Destruction method: cryotherapy   Informed consent: discussed and consent obtained   Timeout:  patient name, date of birth, surgical site, and procedure verified Lesion destroyed using liquid nitrogen: Yes   Cryotherapy cycles:  1 Outcome: patient tolerated procedure well with no complications   Post-procedure details: wound care instructions given    AK (actinic keratosis) Chest - Medial (Center)  Destruction of lesion - Chest - Medial (Center) Complexity: simple   Destruction method: cryotherapy   Informed consent: discussed and consent obtained   Timeout:  patient name, date of birth, surgical site, and procedure verified Lesion destroyed using liquid nitrogen: Yes   Outcome: patient tolerated procedure well with no complications    fluorouracil (EFUDEX) 5 % cream - Chest - Medial (Center)  Rosacea Anterior (Face)  metroNIDAZOLE (METROCREAM) 0.75 % cream - Anterior (Face)   I, Bayler Gehrig, PA-C, have reviewed all documentation's for this visit.  The documentation on 03/26/20 for the exam, diagnosis, procedures and orders are all accurate and complete.

## 2020-03-26 NOTE — Patient Instructions (Signed)

## 2020-04-05 ENCOUNTER — Telehealth: Payer: Self-pay | Admitting: *Deleted

## 2020-04-05 NOTE — Telephone Encounter (Signed)
Fluorouracil 5 % cream needs prior authorization- done via cover my meds-   Valerie Patterson (Key: [facility])  Your information has been submitted to [place] Medicare Part D. [employer] will review the request and will issue a decision, typically within 1-3 days from your submission. You can check the updated outcome later by reopening this request.  If [employer] has not responded in 1-3 days or if you have any questions about your ePA request, please contact [place] [employer] Part D at [phone]. If you think there may be a problem with your PA request, use our live chat feature at the bottom right.

## 2020-04-07 ENCOUNTER — Telehealth: Payer: Self-pay | Admitting: *Deleted

## 2020-04-07 NOTE — Telephone Encounter (Signed)
Fluorouracil 5% cream approved  Ninfa Orth (Key: [id number])  This request has been approved.  Please note any additional information provided by [employer] D at the bottom of your screen.

## 2020-07-20 ENCOUNTER — Other Ambulatory Visit: Payer: Self-pay | Admitting: Physician Assistant

## 2020-07-20 DIAGNOSIS — L570 Actinic keratosis: Secondary | ICD-10-CM

## 2020-09-27 ENCOUNTER — Ambulatory Visit (INDEPENDENT_AMBULATORY_CARE_PROVIDER_SITE_OTHER): Payer: Medicare Other

## 2020-09-27 ENCOUNTER — Other Ambulatory Visit: Payer: Self-pay

## 2020-09-27 ENCOUNTER — Encounter: Payer: Self-pay | Admitting: Internal Medicine

## 2020-09-27 ENCOUNTER — Ambulatory Visit (INDEPENDENT_AMBULATORY_CARE_PROVIDER_SITE_OTHER): Payer: Medicare Other | Admitting: Internal Medicine

## 2020-09-27 DIAGNOSIS — J4530 Mild persistent asthma, uncomplicated: Secondary | ICD-10-CM | POA: Diagnosis not present

## 2020-09-27 DIAGNOSIS — R058 Other specified cough: Secondary | ICD-10-CM

## 2020-09-27 MED ORDER — BISOPROLOL FUMARATE 5 MG PO TABS: 5.0000 mg | ORAL_TABLET | Freq: Every day | ORAL | 11 refills | Status: AC

## 2020-09-27 MED ORDER — FAMOTIDINE 20 MG PO TABS: ORAL_TABLET | ORAL | 11 refills | Status: AC

## 2020-09-27 MED ORDER — BUDESONIDE-FORMOTEROL FUMARATE 80-4.5 MCG/ACT IN AERO: INHALATION_SPRAY | RESPIRATORY_TRACT | 11 refills | Status: DC

## 2020-09-27 MED ORDER — ALBUTEROL SULFATE HFA 108 (90 BASE) MCG/ACT IN AERS: INHALATION_SPRAY | RESPIRATORY_TRACT | 11 refills | Status: AC

## 2020-09-27 MED ORDER — PREDNISONE 10 MG PO TABS: ORAL_TABLET | ORAL | 0 refills | Status: DC

## 2020-09-27 NOTE — Patient Instructions (Addendum)
Plan A = Automatic = Always=  Symbicort 80 Take 2 puffs first thing in am and then another 2 puffs about 12 hours later.   Work on inhaler technique:  relax and gently blow all the way out then take a nice smooth deep breath back in, triggering the inhaler at same time you start breathing in.  Hold for up to 5 seconds if you can. Blow symbicort 80 out thru nose. Rinse and gargle with water when done   Plan B = Backup (to supplement plan A, not to replace it) Only use your albuterol inhaler as a rescue medication to be used if you can't catch your breath by resting or doing a relaxed purse lip breathing pattern.  - The less you use it, the better it will work when you need it. - Ok to use the inhaler up to 1-2 puffs  every 4 hours if you must but call for appointment if use goes up over your usual need - Don't leave home without it !!  (think of it like the spare tire for your car)   Plan C = Crisis (instead of Plan B but only if Plan B stops working) - only use your albuterol nebulizer if you first try Plan B and it fails to help > ok to use the nebulizer up to every 4 hours but if start needing it regularly call for immediate appointment  Omeprazole  40 mg   Take  30-60 min before first meal of the day and Pepcid (famotidine)  20 mg one after supper  until return to office - this is the best way to tell whether stomach acid is contributing to your problem.    Prednisone 10 mg take  4 each am x 2 days,   2 each am x 2 days,  1 each am x 2 days and stop    GERD (REFLUX)  is an extremely common cause of respiratory symptoms just like yours , many times with no obvious heartburn at all.    It can be treated with medication, but also with lifestyle changes including elevation of the head of your bed (ideally with 6-8inch blocks under the headboard of your bed),  Smoking cessation, avoidance of late meals, excessive alcohol, and avoid fatty foods, chocolate, peppermint, colas, red wine, and acidic  juices such as orange juice.  NO MINT OR MENTHOL PRODUCTS SO NO COUGH DROPS  USE SUGARLESS CANDY INSTEAD (Jolley ranchers or Stover's or Life Savers) or even ice chips will also do - the key is to swallow to prevent all throat clearing. NO OIL BASED VITAMINS - use powdered substitutes.  Avoid fish oil when coughing.   Please schedule a follow up office visit in 3 weeks [place] , call sooner if needed with all medications /inhalers/ solutions in hand so we can verify exactly what you are taking. This includes all medications from all doctors and over the [place] separate them into two bags:  the ones you take automatically, no matter what, vs the ones you take just when you feel you need them "BAG #2 is UP TO YOU"  - this will really help [place] help you take your medications more effectively.

## 2020-09-27 NOTE — Progress Notes (Unsigned)
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   Allergy eval in [place] pos dust / mold on shots but didn't seem to  help in her 4s    History of Present Illness  12/18/2014 1st [facility] office visit/ [facility]   Chief Complaint  Patient presents with  . Pulmonary Consult    Self referral. Pt states dxed with Asthma in 2008. She c/o increased SOB for the past month- better with taking albuterol nebs 3 x daily.  She also c/o cough with clear sputum and PND.   worse since last set of pfts 11/17/14 which showed minimal airflow obstruction  Main symptom now is constant urge to clear throat/ worse when eat/ also when lies down but once goes to sleep has no problem and activity is limited by hip pain, no doe. rec Pantoprazole (protonix) 40 mg   Take  30-60 min before first meal of the day and Zantac 150  At bedtime @  bedtime until return to office - this is the best way to tell whether stomach acid is contributing to your problem.   GERD diet Prednisone 10 mg take  4 each am x 2 days,   2 each am x 2 days,  1 each am x 2 days and stop  Stop dulera 200 and the tudorza and use dulera 100 Take 2 puffs first thing in am and then another 2 puffs about 12 hours later.  Work on inhaler technique:   Only use your albuterol (proaire) as a rescue medication  Only use your nebulizer albuterol if you try the Proaire (inhaler albuterol) and it fails to work    NP recs  03/19/15 Zpack take as directed.  Mucinex DM Twice daily  As needed  Cough/congestion  Saline nasal rinses As needed   Continue on Dulera 2 puffs Twice daily  , rinse after use.  Continue on Zyrtec 10mg   At bedtime   Please contact office for sooner follow up if symptoms do not improve or worsen or seek emergency care  follow up Dr. Melvyn Novas  In 2 months and As needed    06/09/15  Bilateral FES, nasal plypectomy, nasal septoplasty and bilateral inf turb reduction/ shoemaker   09/27/2020   Re-establish Wert re:  Every fall to winter   needs dulera few days to weeks does need nebulizer but says can't use inhaler - very confusing hx with mixing and matching maint vs prns Chief Complaint  Patient presents with  . Consult    Asthma  presently took the very last dose of dulera night prior to visit and can't afford more  Dyspnea:  Not limited by breathing from desired activities  But not very active  Cough: none presently Sleeping: wheezing nightly since nov 2021 but not used any saba noct and doesn't make her sob, just noisy, taking ppi with bfast only  SABA use: last  02:  None  Covid status:   vax x 3  Cat gone a year    No obvious day to day or daytime variability or assoc excess/ purulent sputum or mucus plugs or hemoptysis or  cp or chest tightness,  or overt sinus or hb symptoms.     Also denies any obvious fluctuation of symptoms with weather or environmental changes or other aggravating or alleviating factors except as outlined above   No unusual exposure hx or h/o childhood pna/ asthma or knowledge of premature birth.  Current Allergies, Complete Past Medical History, Past Surgical History, Family History, and Social History were reviewed in [employer] record.  ROS  The following are not active complaints unless bolded Hoarseness, sore throat/globus, dysphagia, dental problems, itching, sneezing,  nasal congestion or discharge of excess mucus or purulent secretions, ear ache,   fever, chills, sweats, unintended wt loss or wt gain, classically pleuritic or exertional cp,  orthopnea pnd or arm/hand swelling  or leg swelling, presyncope, palpitations, abdominal pain,  anorexia, nausea, vomiting, diarrhea  or change in bowel habits or change in bladder habits, change in stools or change in urine, dysuria, hematuria,  rash, arthralgias, visual complaints, headache, numbness, weakness or ataxia or problems with walking or coordination,  change in mood or  memory.        Current Meds  Medication Sig  . acetaminophen (TYLENOL) 650 MG CR tablet Take 650 mg by mouth every 8 (eight) hours as needed for pain.  Marland Kitchen albuterol (PROAIR HFA) 108 (90 BASE) MCG/ACT inhaler 2 puffs every 4 hours as needed only  if your can't catch your breath (Patient taking differently: Inhale 2 puffs into the lungs every 4 (four) hours as needed for wheezing or shortness of breath. as needed only  if your can't catch your breath)  . aspirin EC 325 MG EC tablet Take 1 tablet (325 mg total) by mouth 2 (two) times daily.  Marland Kitchen b complex vitamins tablet Take 2 tablets by mouth daily. gummies  . Biotin w/ Vitamins C & E (HAIR SKIN & NAILS GUMMIES PO) Take 2 tablets by mouth daily.  . Biotin w/ Vitamins C & E (HAIR/SKIN/NAILS PO) Take by mouth.  . Calcium Carb-Cholecalciferol 600-800 MG-UNIT TABS Take 1 tablet by mouth daily.  . cetirizine (ZYRTEC) 10 MG tablet Take 10 mg by mouth daily.  . clindamycin (CLEOCIN T) 1 % lotion Apply topically daily.  . fenofibrate 54 MG tablet   . fluorouracil (EFUDEX) 5 % cream Apply topically at bedtime. X 2 weeks expect irritation.  Marland Kitchen levalbuterol (XOPENEX) 1.25 MG/3ML nebulizer solution Inhale into the lungs.  Marland Kitchen loratadine (CLARITIN REDITABS) 10 MG dissolvable tablet Take by mouth.  . methocarbamol (ROBAXIN) 500 MG tablet Take 1-2 tablets (500-1,000 mg total) by mouth every 6 (six) hours as needed for muscle spasms.  . metoprolol tartrate (LOPRESSOR) 50 MG tablet Take 50 mg by mouth 2 (two) times daily.  . metroNIDAZOLE (METROCREAM) 0.75 % cream Apply topically 2 (two) times daily.  . mometasone-formoterol (DULERA) 200-5 MCG/ACT AERO Inhale 1 puff into the lungs 2  (two) times daily.   . Multiple Vitamins-Minerals (ICAPS AREDS 2) CAPS Take 2 capsules by mouth daily.  Marland Kitchen omeprazole (PRILOSEC) 40 MG capsule omeprazole 40 mg capsule,delayed release  take 1 capsule po qd  . ondansetron (ZOFRAN ODT) 4 MG disintegrating tablet Take 1 tablet (4 mg total) by mouth every 8 (eight) hours as needed for nausea or vomiting.  Marland Kitchen OVER THE COUNTER MEDICATION Take 1 tablet by mouth daily. [employer] brand of Estroblend  . oxycodone (OXY-IR) 5 MG capsule Take by mouth.  . ranitidine (ZANTAC) 150 MG tablet One at bedtime (Patient taking differently: Take 150-300 mg by  mouth 2 (two) times daily. 150 mg in the morning and 300 mg in the evening)  . sertraline (ZOLOFT) 100 MG tablet Take 100 mg by mouth daily before breakfast.   . traMADol (ULTRAM) 50 MG tablet Take 1-2 tablets (50-100 mg total) by mouth every 6 (six) hours.  . triamcinolone (NASACORT) 55 MCG/ACT AERO nasal inhaler Place 2 sprays into the nose 2 (two) times daily.                    Objective:   Physical Exam    Wt Readings from Last 3 Encounters:  09/27/20 153 lb (69.4 kg)  11/06/16 148 lb 12.8 oz (67.5 kg)  06/09/15 144 lb (65.3 kg)      Vital signs reviewed  09/27/2020  - Note at rest 02 sats  96% on RA   General appearance:    Anxious amb wf really challenging historian   HEENT : pt wearing mask not removed for exam due to covid -19 concerns.    NECK :  without JVD/Nodes/TM/ nl carotid upstrokes bilaterally   LUNGS: no acc muscle use,  Nl contour chest which is clear to A and P bilaterally without cough on insp or exp maneuvers   CV:  RRR  no s3 or murmur or increase in P2, and no edema   ABD:  soft and nontender with nl inspiratory excursion in the supine position. No bruits or organomegaly appreciated, bowel sounds nl  MS:  Nl gait/ ext warm without deformities, calf tenderness, cyanosis or clubbing No obvious joint restrictions   SKIN: warm and dry without lesions    NEURO:   alert, approp, nl sensorium with  no motor or cerebellar deficits apparent.       CXR PA and Lateral:   09/27/2020 :    I personally reviewed images and agree with radiology impression as follows:    Lungs clear.  Cardiac silhouette normal.  Labs ordered 09/27/2020  :  allergy profile         Assessment & Plan:

## 2020-09-28 ENCOUNTER — Encounter: Payer: Self-pay | Admitting: Internal Medicine

## 2020-09-28 ENCOUNTER — Telehealth: Payer: Self-pay | Admitting: Internal Medicine

## 2020-09-28 ENCOUNTER — Encounter: Payer: Self-pay | Admitting: *Deleted

## 2020-09-28 LAB — CBC WITH DIFFERENTIAL/PLATELET
Basophils Absolute: 0.1 10*3/uL (ref 0.0–0.1)
Basophils Relative: 0.8 % (ref 0.0–3.0)
Eosinophils Absolute: 0.2 10*3/uL (ref 0.0–0.7)
Eosinophils Relative: 2.9 % (ref 0.0–5.0)
HCT: 37.1 % (ref 36.0–46.0)
Hemoglobin: 12.6 g/dL (ref 12.0–15.0)
Lymphocytes Relative: 38 % (ref 12.0–46.0)
Lymphs Abs: 2.9 10*3/uL (ref 0.7–4.0)
MCHC: 33.9 g/dL (ref 30.0–36.0)
MCV: 85.1 fl (ref 78.0–100.0)
Monocytes Absolute: 0.4 10*3/uL (ref 0.1–1.0)
Monocytes Relative: 5.6 % (ref 3.0–12.0)
Neutro Abs: 4 10*3/uL (ref 1.4–7.7)
Neutrophils Relative %: 52.7 % (ref 43.0–77.0)
Platelets: 261 10*3/uL (ref 150.0–400.0)
RBC: 4.35 Mil/uL (ref 3.87–5.11)
RDW: 14 % (ref 11.5–15.5)
WBC: 7.5 10*3/uL (ref 4.0–10.5)

## 2020-09-28 LAB — IGE: IgE (Immunoglobulin E), Serum: 12 kU/L (ref <=114)

## 2020-09-28 NOTE — Telephone Encounter (Signed)
It is clear if she reads the AVS but apparently they don't have access to it.   Bisprolol is 5 mg daily in place of metaprolol bid  Symbicort 80 2bid replacing dulera  pepcid 20mg  is after supper and does not replace omeprazole which is Taken 30-60 min before first meal of the day

## 2020-09-28 NOTE — Telephone Encounter (Signed)
Call returned to patient, confirmed DOB. Spoke with Valerie Patterson. Made aware of MW recommendations.   Nothing further needed at this time.

## 2020-09-28 NOTE — Telephone Encounter (Signed)
Call returned to pharmacy, confirmed patient DOB. They are inquiring about patient medications. They want to know if Symbicort is replacing dulera, if Bispropolol is replacing metroprolol, and if pepcid is replacing omeprazole.   MW please advise if:  1.  Bisoprolol is replacing Metroprolol. 2. Pepcid is replacing omeprazole 3. Symbicort is replacing Dulera  Thanks :)

## 2020-09-28 NOTE — Assessment & Plan Note (Addendum)
Onset of symptoms around 2008 with background of allergic rhinitis with neg resp to allergy shots in her 79s - PFTs  11/17/14  FEV1  2.10 (96%) ratio 68 with dlco 108  And no sign response to saba -Allergy profile 09/27/2020 >  Eos 0.2 /  IgE  pending - trial of symb 80 2bid as can't afford dulera on her plan 09/27/2020 >>>  DDX of  difficult airways management almost all start with A and  include Adherence, Ace Inhibitors, Acid Reflux, Active Sinus Disease, Alpha 1 Antitripsin deficiency, Anxiety masquerading as Airways dz,  ABPA,  Allergy(esp in young), Aspiration (esp in elderly), Adverse effects of meds,  Active smoking or vaping, A bunch of PE's (a small clot burden can't cause this syndrome unless there is already severe underlying pulm or vascular dz with poor reserve) plus two Bs  = Bronchiectasis and Beta blocker use..and one C= CHF   Adherence is always the initial "prime suspect" and is a multilayered concern that requires a "trust but verify" approach in every patient - starting with knowing how to use medications, especially inhalers, correctly, keeping up with refills and understanding the fundamental difference between maintenance and prns vs those medications only taken for a very short course and then stopped and not refilled.  - - The proper method of use, as well as anticipated side effects, of a metered-dose inhaler were discussed and demonstrated to the patient using teach back method  - return with all meds in hand using a trust but verify approach to confirm accurate Medication  Reconciliation The principal here is that until we are certain that the  patients are doing what we've asked, it makes no sense to ask them to do more.    ? Allergy > check profile  Prednisone 10 mg take  4 each am x 2 days,   2 each am x 2 days,  1 each am x 2 days and stop    ? Acid (or non-acid) GERD > always difficult to exclude as up to 75% of pts in some series report no assoc GI/ Heartburn symptoms>  rec max (24h)  acid suppression and diet restrictions/ reviewed and instructions given in writing.   ? Adverse drug effects > none of the usual suspects listed   ? Active sinus dz / rhinitis > note prior ct sinus surgery helped a lot  > rx nasacort zyrtec / see uacs a/p  ? BB effects > see hbp

## 2020-09-28 NOTE — Assessment & Plan Note (Addendum)
Sinus CT 04/07/2015 Severe diffuse sinusitis, with chronic appearance.> ent eval 05/31/15 shoemaker > rec FESS >  06/09/15  Bilateral FESS, nasal plypectomy, nasal septoplasty and bilateral inf turb reduction/ shoemaker  >>> consider repeat ct sinus at next ov if not better         Each maintenance medication was reviewed in detail including emphasizing most importantly the difference between maintenance and prns and under what circumstances the prns are to be triggered using an action plan format where appropriate.  Total time for H and P, chart review, counseling, reviewing hfa device(s) and generating customized AVS unique to this office visit / same day charting =  36 min

## 2020-09-29 MED ORDER — BREZTRI AEROSPHERE 160-9-4.8 MCG/ACT IN AERO: 1.0000 | INHALATION_SPRAY | Freq: Two times a day (BID) | RESPIRATORY_TRACT | 3 refills | Status: AC

## 2020-09-29 NOTE — Telephone Encounter (Signed)
Would try breztri but just one puff bid to start as it's a lot stronger than the symbicort 80 2bid

## 2020-09-29 NOTE — Telephone Encounter (Signed)
Please see email from pt and advise recs, thanks  Valerie Patterson H  P [facility] the symbicort was $300 generic. I am able to obtain Breztri at a lower rate. Is this a usable substitute?  Thanks and see you in 3 weeks.  Valerie Patterson

## 2020-10-19 ENCOUNTER — Other Ambulatory Visit: Payer: Self-pay

## 2020-10-19 ENCOUNTER — Ambulatory Visit (INDEPENDENT_AMBULATORY_CARE_PROVIDER_SITE_OTHER): Payer: Medicare Other | Admitting: Internal Medicine

## 2020-10-19 ENCOUNTER — Encounter: Payer: Self-pay | Admitting: Internal Medicine

## 2020-10-19 DIAGNOSIS — J4530 Mild persistent asthma, uncomplicated: Secondary | ICD-10-CM

## 2020-10-19 DIAGNOSIS — R058 Other specified cough: Secondary | ICD-10-CM

## 2020-10-19 MED ORDER — ALBUTEROL SULFATE HFA 108 (90 BASE) MCG/ACT IN AERS: INHALATION_SPRAY | RESPIRATORY_TRACT | 11 refills | Status: DC

## 2020-10-19 MED ORDER — BUDESONIDE-FORMOTEROL FUMARATE 80-4.5 MCG/ACT IN AERO: INHALATION_SPRAY | RESPIRATORY_TRACT | 12 refills | Status: AC

## 2020-10-19 NOTE — Progress Notes (Signed)
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   Allergy eval in [place] pos dust / mold on shots but didn't seem to  help in her 18s    History of Present Illness  12/18/2014 1st [facility] office visit/ Valerie Patterson   Chief Complaint  Patient presents with  . Pulmonary Consult    Self referral. Pt states dxed with Asthma in 2008. She c/o increased SOB for the past month- better with taking albuterol nebs 3 x daily.  She also c/o cough with clear sputum and PND.   worse since last set of pfts 11/17/14 which showed minimal airflow obstruction  Main symptom now is constant urge to clear throat/ worse when eat/ also when lies down but once goes to sleep has no problem and activity is limited by hip pain, no doe. rec Pantoprazole (protonix) 40 mg   Take  30-60 min before first meal of the day and Zantac 150  At bedtime @  bedtime until return to office - this is the best way to tell whether stomach acid is contributing to your problem.   GERD diet Prednisone 10 mg take  4 each am x 2 days,   2 each am x 2 days,  1 each am x 2 days and stop  Stop dulera 200 and the tudorza and use dulera 100 Take 2 puffs first thing in am and then another 2 puffs about 12 hours later.  Work on inhaler technique:   Only use your albuterol (proaire) as a rescue medication  Only use your nebulizer albuterol if you try the Proaire (inhaler albuterol) and it fails to work    NP recs  03/19/15 Zpack take as directed.  Mucinex DM Twice daily  As needed  Cough/congestion  Saline nasal rinses As needed   Continue on Dulera 2 puffs Twice daily  , rinse after use.  Continue on Zyrtec 10mg   At bedtime   Please contact office for sooner follow up if symptoms do not improve or worsen or seek emergency care  follow up Dr. Melvyn Novas  In 2 months and As needed    06/09/15  Bilateral FES, nasal plypectomy, nasal septoplasty and bilateral inf turb reduction/ shoemaker   09/27/2020   Re-establish Valerie Patterson re:  Every fall to winter   needs dulera few days to weeks does need nebulizer but says can't use inhaler - very confusing hx with mixing and matching maint vs prns Chief Complaint  Patient presents with  . Consult    Asthma  presently took the very last dose of dulera night prior to visit and can't afford more  Dyspnea:  Not limited by breathing from desired activities  But not very active  Cough: none presently Sleeping: wheezing nightly since nov 2021 but not used any saba noct and doesn't make her sob, just noisy, taking ppi with bfast only  SABA use: last  02:  None  Covid status:   vax x 3  Cat gone a year  rec Plan A = Automatic = Always=  Symbicort 80 Take 2 puffs first thing in am and then another 2  puffs about 12 hours later.  Work on inhaler technique: Plan B = Backup (to supplement plan A, not to replace it) Only use your albuterol inhaler as a rescue medication Plan C = Crisis (instead of Plan B but only if Plan B stops working) - only use your albuterol nebulizer if you first try Plan B and it fails to help Omeprazole  40 mg   Take  30-60 min before first meal of the day and Pepcid (famotidine)  20 mg one after supper  until return to office - this is the best way to tell whether stomach acid is contributing to your problem.   Prednisone 10 mg take  4 each am x 2 days,   2 each am x 2 days,  1 each am x 2 days and stop  GERD  Please schedule a follow up office visit in 3 weeks [place] , call sooner if needed with all medications   10/19/2020  f/u ov/[place] office/Valerie Patterson re: mild asthma / breztri one each am  Chief Complaint  Patient presents with  . Follow-up     Breathing is "probably better". She is only taking her Breztri. She has not needed her albuterol inhaler.   Dyspnea: Not limited by breathing from desired activities   Cough: still clearing the throat  Sleeping: immediate wheezeing at hs then does not wake her up  SABA use: none  02: none  Covid status: vax x 3  Lung cancer screening: N/a    No obvious day to day or daytime variability or assoc excess/ purulent sputum or mucus plugs or hemoptysis or cp or chest tightness, subjective wheeze or overt sinus or hb symptoms.    Sleeping  without nocturnal  or early am exacerbation  of respiratory  c/o's or need for noct saba. Also denies any obvious fluctuation of symptoms with weather or environmental changes or other aggravating or alleviating factors except as outlined above   No unusual exposure hx or h/o childhood pna/ asthma or knowledge of premature birth.  Current Allergies, Complete Past Medical History, Past Surgical History, Family History, and Social History were reviewed in [employer] [facility] record.  ROS  The following are not active complaints unless bolded Hoarseness, sore throat, dysphagia, dental problems, itching, sneezing,  nasal congestion or discharge of excess mucus or purulent secretions, ear ache,   fever, chills, sweats, unintended wt loss or wt gain, classically pleuritic or exertional cp,  orthopnea pnd or arm/hand swelling  or leg swelling, presyncope, palpitations, abdominal pain, anorexia, nausea, vomiting, diarrhea  or change in bowel habits or change in bladder habits, change in stools or change in urine, dysuria, hematuria,  rash, arthralgias, visual complaints, headache, numbness, weakness or ataxia or problems with walking or coordination,  change in mood or  memory.        Current Meds  Medication Sig  . acetaminophen (TYLENOL) 650 MG CR tablet Take 650 mg by mouth every 8 (eight) hours as needed for pain.  Marland Kitchen albuterol (PROAIR HFA) 108 (90  Base) MCG/ACT inhaler 1-2 puffs every 4 hours as needed only  if your can't catch your breath  . bisoprolol (ZEBETA) 5 MG tablet Take 1 tablet (5 mg total) by mouth daily.  . Budeson-Glycopyrrol-Formoterol (BREZTRI AEROSPHERE) 160-9-4.8 MCG/ACT AERO Inhale 1 puff into the lungs in the morning and at bedtime.  . cetirizine (ZYRTEC) 10 MG tablet Take 10 mg by mouth daily.  . cholecalciferol (VITAMIN D3) 25 MCG (1000 UNIT) tablet Take 1,000  Units by mouth daily.  . famotidine (PEPCID) 20 MG tablet One after supper  . fenofibrate 54 MG tablet   . hydrochlorothiazide (MICROZIDE) 12.5 MG capsule Take 12.5 mg by mouth daily.  . Multiple Vitamins-Minerals (ICAPS AREDS 2) CAPS Take 2 capsules by mouth daily.  Marland Kitchen omeprazole (PRILOSEC) 40 MG capsule omeprazole 40 mg capsule,delayed release  take 1 capsule po qd  . rOPINIRole (REQUIP) 0.25 MG tablet Take 0.25 mg by mouth 3 (three) times daily.  . sertraline (ZOLOFT) 100 MG tablet Take 100 mg by mouth daily before breakfast.   . triamcinolone (NASACORT) 55 MCG/ACT AERO nasal inhaler Place 2 sprays into the nose 2 (two) times daily.                                Objective:   Physical Exam    10/19/2020          154   09/27/20 153 lb (69.4 kg)  11/06/16 148 lb 12.8 oz (67.5 kg)  06/09/15 144 lb (65.3 kg)     Vital signs reviewed  10/19/2020  - Note at rest 02 sats  96% on RA   General appearance:    amb anxious wf/ vigorous throat clearing, min pseudowheeze resolves with plm   HEENT : pt wearing mask not removed for exam due to covid -19 concerns.    NECK :  without JVD/Nodes/TM/ nl carotid upstrokes bilaterally   LUNGS: no acc muscle use,  Nl contour chest which is clear to A and P bilaterally without cough on insp or exp maneuvers   CV:  RRR  no s3 or murmur or increase in P2, and no edema   ABD:  soft and nontender with nl inspiratory excursion in the supine position. No bruits or organomegaly appreciated, bowel sounds nl  MS:   Nl gait/ ext warm without deformities, calf tenderness, cyanosis or clubbing No obvious joint restrictions   SKIN: warm and dry without lesions    NEURO:  alert, approp, nl sensorium with  no motor or cerebellar deficits apparent.           Assessment & Plan:

## 2020-10-19 NOTE — Assessment & Plan Note (Signed)
Sinus CT 04/07/2015 Severe diffuse sinusitis, with chronic appearance > ent eval 05/31/15 shoemaker > rec FESS >  06/09/15  Bilateral FES, nasal plypectomy, nasal septoplasty and bilateral inf turb reduction/ shoemaker  Continues to have pseudowheeze with throat clearing so rec max gerd, diet and f/u ENT either with Dr Wilburn Cornelia or refer to [facility]/ Dr Bettina Gavia.         Each maintenance medication was reviewed in detail including emphasizing most importantly the difference between maintenance and prns and under what circumstances the prns are to be triggered using an action plan format where appropriate.  Total time for H and P, chart review, counseling, reviewing hfa  device(s) and generating customized AVS unique to this final summary  office visit / same day charting > 30 min

## 2020-10-19 NOTE — Assessment & Plan Note (Signed)
Onset of symptoms around 2008 with background of allergic rhinitis with neg resp to allergy shots in her 71s - PFTs  11/17/14  FEV1  2.10 (96%) ratio 68 with dlco 108  And no sign response to saba -Allergy profile 09/27/2020 >  Eos 0.2 /  IgE  12 - trial of symb 80 2bid as can't afford dulera on her plan 09/27/2020 >>> ? Affordable   - The proper method of use, as well as anticipated side effects, of a metered-dose inhaler were discussed and demonstrated to the patient using teach back method. Improved effectiveness after extensive coaching during this visit to a level of approximately 75 % from a baseline of 25 % > suspect not really ever using hfa correctly with lots of upper airway issues so rec Based on two studies from [place]; 20 p 1865 (2018) and 380 : p2020-30 (2019) in pts with mild asthma it is reasonable to use low dose symbicort eg 80 2bid "prn" flare in this setting but I emphasized this was only shown with symbicort and takes advantage of the rapid onset of action but is not the same as "rescue therapy" but can be stopped once the acute symptoms have resolved and the need for rescue has been minimized (< 2 x weekly)    Pulmonary f/u can be prn

## 2020-10-19 NOTE — Patient Instructions (Addendum)
Options generic symbicort 80/ dulera 100 or advair 44  Take 2 puffs first thing in am and then another 2 puffs about 12 hours later until 100%  Better for a week then ok to adjust down   Work on inhaler technique:  relax and gently blow all the way out then take a nice smooth deep breath back in, triggering the inhaler at same time you start breathing in.  Hold for up to 5 seconds if you can. Blow out thru nose. Rinse and gargle with water when done    Only use your albuterol as a rescue medication to be used if you can't catch your breath by resting or doing a relaxed purse lip breathing pattern.  - The less you use it, the better it will work when you need it. - Ok to use up to 2 puffs  every 4 hours if you must but call for immediate appointment if use goes up over your usual need - Don't leave home without it !!  (think of it like the spare tire for your car)    Follow up here is as needed

## 2021-04-11 ENCOUNTER — Other Ambulatory Visit: Payer: Self-pay

## 2021-04-11 MED ORDER — ALBUTEROL SULFATE HFA 108 (90 BASE) MCG/ACT IN AERS: INHALATION_SPRAY | RESPIRATORY_TRACT | 11 refills | Status: AC

## 2021-08-21 ENCOUNTER — Other Ambulatory Visit: Payer: Self-pay | Admitting: Internal Medicine

## 2021-09-04 ENCOUNTER — Other Ambulatory Visit: Payer: Self-pay | Admitting: Internal Medicine

## 2022-05-02 ENCOUNTER — Ambulatory Visit: Payer: Medicare Other | Admitting: Physician Assistant
